# Patient Record
Sex: Male | Born: 1945 | Race: White | Hispanic: No | Marital: Married | Smoking: Former smoker
Health system: Southern US, Community
[De-identification: ages and names within clinical notes are randomized; demographics above are authoritative.]

## PROBLEM LIST (undated history)

## (undated) DIAGNOSIS — N189 Chronic kidney disease, unspecified: Secondary | ICD-10-CM

## (undated) DIAGNOSIS — E785 Hyperlipidemia, unspecified: Secondary | ICD-10-CM

## (undated) DIAGNOSIS — I10 Essential (primary) hypertension: Secondary | ICD-10-CM

## (undated) DIAGNOSIS — K219 Gastro-esophageal reflux disease without esophagitis: Secondary | ICD-10-CM

## (undated) DIAGNOSIS — E039 Hypothyroidism, unspecified: Secondary | ICD-10-CM

## (undated) HISTORY — DX: Hypothyroidism, unspecified: E03.9

## (undated) HISTORY — DX: Gastro-esophageal reflux disease without esophagitis: K21.9

## (undated) HISTORY — DX: Chronic kidney disease, unspecified: N18.9

## (undated) HISTORY — DX: Hyperlipidemia, unspecified: E78.5

## (undated) HISTORY — DX: Essential (primary) hypertension: I10

---
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
## 1993-05-31 HISTORY — PX: FOOT FUSION: SHX956

## 2003-06-01 HISTORY — PX: HERNIA REPAIR: SHX51

## 2009-07-11 ENCOUNTER — Encounter: Admission: RE | Admit: 2009-07-11 | Discharge: 2009-07-11 | Payer: Self-pay | Admitting: Nephrology

## 2011-08-02 ENCOUNTER — Encounter: Payer: Self-pay | Admitting: Internal Medicine

## 2011-09-07 ENCOUNTER — Encounter: Payer: Self-pay | Admitting: Internal Medicine

## 2011-09-07 ENCOUNTER — Ambulatory Visit (INDEPENDENT_AMBULATORY_CARE_PROVIDER_SITE_OTHER): Payer: BC Managed Care – PPO | Admitting: Internal Medicine

## 2011-09-07 VITALS — BP 108/74 | HR 64 | Ht 70.0 in | Wt 191.0 lb

## 2011-09-07 DIAGNOSIS — K219 Gastro-esophageal reflux disease without esophagitis: Secondary | ICD-10-CM

## 2011-09-07 MED ORDER — RANITIDINE HCL 150 MG PO TABS: 150.0000 mg | ORAL_TABLET | Freq: Two times a day (BID) | ORAL | Status: AC

## 2011-09-07 NOTE — Patient Instructions (Signed)
You have been scheduled for an endoscopy with propofol. Please follow written instructions given to you at your visit today.  We have sent the following medications to your pharmacy for you to pick up at your convenience:  Ranitidine (Zantac)  You have been given some information on reflux to read over

## 2011-09-07 NOTE — Progress Notes (Signed)
HISTORY OF PRESENT ILLNESS:  Phillip Alvarado is a 66 y.o. male with hypertension, hypothyroidism, chronic kidney disease, and chronic GERD. The patient is new to this practice. He has undergone prior colonoscopy in [place] in 2005 and in [place], and 2010. Results unknown. He presents today regarding chronic GERD and recommendations for medical therapy. Patient has a history of GERD manifested by pyrosis dating back to 2006. He has self medicated with H2 receptor antagonists and OTC PPIs. These have worked nicely. In 2010 he was diagnosed with kidney disease. I have reviewed a note from his nephrologist. This felt to be on the basis of hypertension. Patient was concerned that this may be related to PPI therapy, having read or side effects interstitial nephritis related to the medication. As such, he has been off PPI therapy for several months. He takes antacids for reflux. He has significant reflux symptoms that effect his quality of life. No dysphagia, though globus type sensation. Review of outside laboratories from last month reveal serum creatinine of 1.92. His GI review of systems is otherwise entirely negative.Marland Kitchen  REVIEW OF SYSTEMS:  All non-GI ROS negative except for sore throat  Past Medical History  Diagnosis Date  . Hypothyroidism   . Chronic kidney disease   . Hypertension     Past Surgical History  Procedure Date  . Appendectomy   . Hernia repair   . Foot fusion 1995    Social History Phillip Alvarado  reports that he has quit smoking. He has never used smokeless tobacco. He reports that he drinks alcohol. He reports that he does not use illicit drugs.  family history includes Diabetes in his brother.  Allergies  Allergen Reactions  . Statins     Muscle aches       PHYSICAL EXAMINATION: Vital signs: BP 108/74  Pulse 64  Ht 5\' 10"  (1.778 m)  Wt 191 lb (86.637 kg)  BMI 27.41 kg/m2  Constitutional: generally well-appearing, no acute distress Psychiatric: alert and  oriented x3, cooperative Eyes: extraocular movements intact, anicteric, conjunctiva pink Mouth: oral pharynx moist, no lesions Neck: supple no lymphadenopathy Cardiovascular: heart regular rate and rhythm, no murmur Lungs: clear to auscultation bilaterally Abdomen: soft, nontender, nondistended, no obvious ascites, no peritoneal signs, normal bowel sounds, no organomegaly Extremities: no lower extremity edema bilaterally Skin: no lesions on visible extremities Neuro: No focal deficits.   ASSESSMENT:  #1. Chronic GERD without alarm features #2. Chronic kidney disease #3. Prior colonoscopy. Results unknown   PLAN:  #1. Given the patient's significant symptoms and concerns over PPI therapy, prescrib ranitidine 150 mg by mouth twice a day #2. Reflux precautions #3. GERD literature and reflux precautions information sheet provided #4. Schedule upper endoscopy to rule out Barrett's esophagus or other complications of GERD. This may affect long-term therapy.The nature of the procedure, as well as the risks, benefits, and alternatives were carefully and thoroughly reviewed with the patient. Ample time for discussion and questions allowed. The patient understood, was satisfied, and agreed to proceed.  #5. Obtain a release form for outside colonoscopy for review and incorporation into his record. He is agreeable

## 2011-09-21 ENCOUNTER — Encounter: Payer: Self-pay | Admitting: Internal Medicine

## 2011-09-21 ENCOUNTER — Ambulatory Visit (AMBULATORY_SURGERY_CENTER): Payer: BC Managed Care – PPO | Admitting: Internal Medicine

## 2011-09-21 VITALS — BP 132/41 | HR 52 | Temp 98.1°F | Resp 14 | Ht 70.0 in | Wt 191.0 lb

## 2011-09-21 DIAGNOSIS — K219 Gastro-esophageal reflux disease without esophagitis: Secondary | ICD-10-CM

## 2011-09-21 DIAGNOSIS — K222 Esophageal obstruction: Secondary | ICD-10-CM

## 2011-09-21 MED ORDER — SODIUM CHLORIDE 0.9 % IV SOLN: 500.0000 mL | INTRAVENOUS | Status: DC

## 2011-09-21 NOTE — Progress Notes (Signed)
Patient did not have preoperative order for IV antibiotic SSI prophylaxis. (G8918)  Patient did not experience any of the following events: a burn prior to discharge; a fall within the facility; wrong site/side/patient/procedure/implant event; or a hospital transfer or hospital admission upon discharge from the facility. (G8907)  

## 2011-09-21 NOTE — Patient Instructions (Signed)
YOU HAD AN ENDOSCOPIC PROCEDURE TODAY AT THE [facility]: Refer to the procedure report that was given to you for any specific questions about what was found during the examination.  If the procedure report does not answer your questions, please call your gastroenterologist to clarify.  If you requested that your care partner not be given the details of your procedure findings, then the procedure report has been included in a sealed envelope for you to review at your convenience later.  YOU SHOULD EXPECT: Some feelings of bloating in the abdomen. Passage of more gas than usual.  Walking can help get rid of the air that was put into your GI tract during the procedure and reduce the bloating. If you had a lower endoscopy (such as a colonoscopy or flexible sigmoidoscopy) you may notice spotting of blood in your stool or on the toilet paper. If you underwent a bowel prep for your procedure, then you may not have a normal bowel movement for a few days.  DIET: Your first meal following the procedure should be a light meal and then it is ok to progress to your normal diet.  A half-sandwich or bowl of soup is an example of a good first meal.  Heavy or fried foods are harder to digest and may make you feel nauseous or bloated.  Likewise meals heavy in dairy and vegetables can cause extra gas to form and this can also increase the bloating.  Drink plenty of fluids but you should avoid alcoholic beverages for 24 hours.  ACTIVITY: Your care partner should take you home directly after the procedure.  You should plan to take it easy, moving slowly for the rest of the day.  You can resume normal activity the day after the procedure however you should NOT DRIVE or use heavy machinery for 24 hours (because of the sedation medicines used during the test).    SYMPTOMS TO REPORT IMMEDIATELY: A gastroenterologist can be reached at any hour.  During normal business hours, 8:30 AM to 5:00 PM Monday through Friday,  call [phone].  After hours and on weekends, please call the GI answering service at [phone] who will take a message and have the physician on call contact you.   Following lower endoscopy (colonoscopy or flexible sigmoidoscopy):  Excessive amounts of blood in the stool  Significant tenderness or worsening of abdominal pains  Swelling of the abdomen that is new, acute  Fever of 100F or higher  Following upper endoscopy (EGD)  Vomiting of blood or coffee ground material  New chest pain or pain under the shoulder blades  Painful or persistently difficult swallowing  New shortness of breath  Fever of 100F or higher  Black, tarry-looking stools  FOLLOW UP: If any biopsies were taken you will be contacted by phone or by letter within the next 1-3 weeks.  Call your gastroenterologist if you have not heard about the biopsies in 3 weeks.  Our staff will call the home number listed on your records the next business day following your procedure to check on you and address any questions or concerns that you may have at that time regarding the information given to you following your procedure. This is a courtesy call and so if there is no answer at the home number and we have not heard from you through the emergency physician on call, we will assume that you have returned to your regular daily activities without incident.  SIGNATURES/CONFIDENTIALITY: You and/or your care   partner have signed paperwork which will be entered into your electronic medical record.  These signatures attest to the fact that that the information above on your After Visit Summary has been reviewed and is understood.  Full responsibility of the confidentiality of this discharge information lies with you and/or your care-partner.  

## 2011-09-21 NOTE — Op Note (Signed)
[facility] [address] [place], [place]  [address]  ENDOSCOPY PROCEDURE REPORT  PATIENT:  Phillip Alvarado, Phillip Alvarado  MR#:  [id number] BIRTHDATE:  [birth year], 66 yrs. old  GENDER:  male  ENDOSCOPIST:  Wilhemina Bonito. Eda Keys, MD Referred by:  Irena Cords, M.D.  PROCEDURE DATE:  09/21/2011 PROCEDURE:  EGD, diagnostic 43235 ASA CLASS:  Class II INDICATIONS:  GERD - longstanding  MEDICATIONS:   MAC sedation, administered by CRNA, propofol (Diprivan) 100 mg IV TOPICAL ANESTHETIC:  none  DESCRIPTION OF PROCEDURE:   After the risks benefits and alternatives of the procedure were thoroughly explained, informed consent was obtained.  The LB GIF-H180 [id number] endoscope was introduced through the mouth and advanced to the second portion of the duodenum, without limitations.  The instrument was slowly withdrawn as the mucosa was fully examined. <<PROCEDUREIMAGES>>  Erosive Esophagitis was found in the distal esophagus as well as a stricture  (Grade IV)..  The stomach was entered and closely examined. The antrum, angularis, and lesser curvature were well visualized, including a retroflexed view of the cardia and fundus. The stomach wall was normally distensable. The scope passed easily through the pylorus into the duodenum.  The duodenal bulb was normal in appearance, as was the postbulbar duodenum. Retroflexed views revealed no abnormalities.    The scope was then withdrawn from the patient and the procedure completed.  COMPLICATIONS:  None  ENDOSCOPIC IMPRESSION: 1) Severe Esophagitis in the distal esophagus 2) Stricture in the distal esophagus 3) Normal stomach 4) Normal duodenum  RECOMMENDATIONS: 1) Anti-reflux regimen to be followed 2) Will likely need PPI daily (he wants to discuss with his nephrologist0  ______________________________ Wilhemina Bonito. Eda Keys, MD  CC:  Terrial Rhodes, MD; Carlyon Shadow MD; The Patient  n. eSIGNED:   Wilhemina Bonito. Eda Keys at 09/21/2011 04:00  PM  Dewayne Shorter, [phone]

## 2011-09-22 ENCOUNTER — Telehealth: Payer: Self-pay | Admitting: *Deleted

## 2011-09-22 NOTE — Telephone Encounter (Signed)
  Follow up Call-  Call back number 09/21/2011  Post procedure Call Back phone  # [phone]  Permission to leave phone message Yes     Patient questions:  Do you have a fever, pain , or abdominal swelling? no Pain Score  0 *  Have you tolerated food without any problems? yes  Have you been able to return to your normal activities? yes  Do you have any questions about your discharge instructions: Diet   no Medications  no Follow up visit  no  Do you have questions or concerns about your Care? no  Actions: * If pain score is 4 or above: No action needed, pain <4.

## 2012-10-30 DIAGNOSIS — D509 Iron deficiency anemia, unspecified: Secondary | ICD-10-CM | POA: Diagnosis not present

## 2012-10-30 DIAGNOSIS — I10 Essential (primary) hypertension: Secondary | ICD-10-CM | POA: Diagnosis not present

## 2012-10-30 DIAGNOSIS — N2581 Secondary hyperparathyroidism of renal origin: Secondary | ICD-10-CM | POA: Diagnosis not present

## 2012-12-08 DIAGNOSIS — E039 Hypothyroidism, unspecified: Secondary | ICD-10-CM | POA: Diagnosis not present

## 2012-12-08 DIAGNOSIS — I10 Essential (primary) hypertension: Secondary | ICD-10-CM | POA: Diagnosis not present

## 2012-12-08 DIAGNOSIS — E782 Mixed hyperlipidemia: Secondary | ICD-10-CM | POA: Diagnosis not present

## 2012-12-08 DIAGNOSIS — K219 Gastro-esophageal reflux disease without esophagitis: Secondary | ICD-10-CM | POA: Diagnosis not present

## 2013-02-07 DIAGNOSIS — D509 Iron deficiency anemia, unspecified: Secondary | ICD-10-CM | POA: Diagnosis not present

## 2013-02-07 DIAGNOSIS — N2581 Secondary hyperparathyroidism of renal origin: Secondary | ICD-10-CM | POA: Diagnosis not present

## 2013-02-07 DIAGNOSIS — I10 Essential (primary) hypertension: Secondary | ICD-10-CM | POA: Diagnosis not present

## 2013-05-04 DIAGNOSIS — E782 Mixed hyperlipidemia: Secondary | ICD-10-CM | POA: Diagnosis not present

## 2013-05-04 DIAGNOSIS — E039 Hypothyroidism, unspecified: Secondary | ICD-10-CM | POA: Diagnosis not present

## 2013-08-12 ENCOUNTER — Emergency Department (HOSPITAL_COMMUNITY): Payer: Medicare Other

## 2013-08-12 ENCOUNTER — Encounter (HOSPITAL_COMMUNITY): Payer: Self-pay | Admitting: Emergency Medicine

## 2013-08-12 ENCOUNTER — Emergency Department (HOSPITAL_COMMUNITY)
Admission: EM | Admit: 2013-08-12 | Discharge: 2013-08-12 | Disposition: A | Discharge status: Home / Self Care | Payer: Medicare Other | Attending: Emergency Medicine | Admitting: Emergency Medicine

## 2013-08-12 DIAGNOSIS — R404 Transient alteration of awareness: Secondary | ICD-10-CM | POA: Diagnosis not present

## 2013-08-12 DIAGNOSIS — N189 Chronic kidney disease, unspecified: Secondary | ICD-10-CM | POA: Diagnosis not present

## 2013-08-12 DIAGNOSIS — R42 Dizziness and giddiness: Secondary | ICD-10-CM | POA: Diagnosis not present

## 2013-08-12 DIAGNOSIS — Z79899 Other long term (current) drug therapy: Secondary | ICD-10-CM | POA: Diagnosis not present

## 2013-08-12 DIAGNOSIS — I129 Hypertensive chronic kidney disease with stage 1 through stage 4 chronic kidney disease, or unspecified chronic kidney disease: Secondary | ICD-10-CM | POA: Insufficient documentation

## 2013-08-12 DIAGNOSIS — Y9301 Activity, walking, marching and hiking: Secondary | ICD-10-CM | POA: Insufficient documentation

## 2013-08-12 DIAGNOSIS — E039 Hypothyroidism, unspecified: Secondary | ICD-10-CM | POA: Insufficient documentation

## 2013-08-12 DIAGNOSIS — Z87891 Personal history of nicotine dependence: Secondary | ICD-10-CM | POA: Insufficient documentation

## 2013-08-12 DIAGNOSIS — S6990XA Unspecified injury of unspecified wrist, hand and finger(s), initial encounter: Secondary | ICD-10-CM | POA: Insufficient documentation

## 2013-08-12 DIAGNOSIS — R55 Syncope and collapse: Secondary | ICD-10-CM | POA: Insufficient documentation

## 2013-08-12 DIAGNOSIS — R296 Repeated falls: Secondary | ICD-10-CM | POA: Insufficient documentation

## 2013-08-12 DIAGNOSIS — Y9229 Other specified public building as the place of occurrence of the external cause: Secondary | ICD-10-CM | POA: Insufficient documentation

## 2013-08-12 DIAGNOSIS — I10 Essential (primary) hypertension: Secondary | ICD-10-CM | POA: Diagnosis not present

## 2013-08-12 LAB — COMPREHENSIVE METABOLIC PANEL
ALT: 29 U/L (ref 0–53)
AST: 34 U/L (ref 0–37)
Albumin: 4.1 g/dL (ref 3.5–5.2)
Alkaline Phosphatase: 51 U/L (ref 39–117)
BILIRUBIN TOTAL: 0.3 mg/dL (ref 0.3–1.2)
BUN: 22 mg/dL (ref 6–23)
CO2: 23 mEq/L (ref 19–32)
Calcium: 9.5 mg/dL (ref 8.4–10.5)
Chloride: 104 mEq/L (ref 96–112)
Creatinine, Ser: 1.76 mg/dL — ABNORMAL HIGH (ref 0.50–1.35)
GFR, EST AFRICAN AMERICAN: 44 mL/min — AB (ref >90)
GFR, EST NON AFRICAN AMERICAN: 38 mL/min — AB (ref >90)
Glucose, Bld: 105 mg/dL — ABNORMAL HIGH (ref 70–99)
POTASSIUM: 3.9 meq/L (ref 3.7–5.3)
SODIUM: 140 meq/L (ref 137–147)
Total Protein: 7 g/dL (ref 6.0–8.3)

## 2013-08-12 LAB — CBC WITH DIFFERENTIAL/PLATELET
BASOS ABS: 0 10*3/uL (ref 0.0–0.1)
Basophils Relative: 0 % (ref 0–1)
EOS ABS: 0.1 10*3/uL (ref 0.0–0.7)
EOS PCT: 1 % (ref 0–5)
HEMATOCRIT: 36.1 % — AB (ref 39.0–52.0)
HEMOGLOBIN: 12.4 g/dL — AB (ref 13.0–17.0)
Lymphocytes Relative: 33 % (ref 12–46)
Lymphs Abs: 1.9 10*3/uL (ref 0.7–4.0)
MCH: 30.6 pg (ref 26.0–34.0)
MCHC: 34.3 g/dL (ref 30.0–36.0)
MCV: 89.1 fL (ref 78.0–100.0)
MONOS PCT: 6 % (ref 3–12)
Monocytes Absolute: 0.3 10*3/uL (ref 0.1–1.0)
Neutro Abs: 3.4 10*3/uL (ref 1.7–7.7)
Neutrophils Relative %: 59 % (ref 43–77)
PLATELETS: 246 10*3/uL (ref 150–400)
RBC: 4.05 MIL/uL — AB (ref 4.22–5.81)
RDW: 13 % (ref 11.5–15.5)
WBC: 5.8 10*3/uL (ref 4.0–10.5)

## 2013-08-12 LAB — I-STAT TROPONIN, ED: Troponin i, poc: 0 ng/mL (ref 0.00–0.08)

## 2013-08-12 LAB — CBG MONITORING, ED: GLUCOSE-CAPILLARY: 145 mg/dL — AB (ref 70–99)

## 2013-08-12 NOTE — ED Provider Notes (Signed)
CSN: [id number]     Arrival date & time 08/12/13  0175 History   First MD Initiated Contact with Patient 08/12/13 1027     Chief Complaint  Patient presents with  . Near Syncope     (Consider location/radiation/quality/duration/timing/severity/associated sxs/prior Treatment) The history is provided by the patient and medical records.  This is a 68 year old male with past medical history significant for hypertension, chronic kidney disease, hyperthyroidism, presenting to the ED via EMS for syncopal episode.  Patient states he was standing in the back of the church for approximately 30 minutes, when he got a flushed sensation over his body. States he was concerned his blood pressure may have dropped too low from standing still, so he attempted to walk around and get some fresh air when he collapsed. Family friend who witnessed incident is present at bedside and denies head trauma.  Witness states patient quickly regained consciousness, attempted to stand and passed out again. EMS was called. Witness states that EMS appeared to be having difficulty finding his pulse, so they went to get the defibrillator but by the time witness returned pt had again regained consciousness.  Patient states he fell somewhat groggy when he came to, but was not disoriented.  He denies prior history of syncopal episodes or seizure disorder. No prior cardiac hx.  Currently he denies any chest pain, shortness of breath, palpitations, dizziness, lightheadedness, or headache.  He does complain of some right hand pain where he thinks he fell onto his hand. He denies any numbness or paresthesias of his extremities.  VS stable on arrival.  Past Medical History  Diagnosis Date  . Hypothyroidism   . Chronic kidney disease   . Hypertension    Past Surgical History  Procedure Laterality Date  . Appendectomy    . Hernia repair    . Foot fusion  1995   Family History  Problem Relation Age of Onset  . Diabetes Brother     History  Substance Use Topics  . Smoking status: Former Research scientist (life sciences)  . Smokeless tobacco: Never Used  . Alcohol Use: Yes    Review of Systems  Neurological: Positive for syncope.  All other systems reviewed and are negative.   Allergies  Statins  Home Medications   Current Outpatient Rx  Name  Route  Sig  Dispense  Refill  . atenolol (TENORMIN) 25 MG tablet   Oral   Take 25 mg by mouth daily.         . Calcium Carbonate Antacid (TUMS CALCIUM FOR LIFE BONE PO)   Oral   Take 1 tablet by mouth daily.         Marland Kitchen doxazosin (CARDURA) 2 MG tablet   Oral   Take 2 mg by mouth at bedtime.         . fenofibrate (TRICOR) 145 MG tablet   Oral   Take 145 mg by mouth daily.         Marland Kitchen levothyroxine (SYNTHROID, LEVOTHROID) 50 MCG tablet   Oral   Take 50 mcg by mouth daily.         . polyethylene glycol (MIRALAX / GLYCOLAX) packet   Oral   Take 17 g by mouth daily.          BP 109/65  Pulse 56  Temp(Src) 97.9 F (36.6 C) (Oral)  Resp 13  SpO2 94%  Physical Exam  Nursing note and vitals reviewed. Constitutional: He is oriented to person, place, and time. He appears well-developed and  well-nourished.  HENT:  Head: Normocephalic and atraumatic.  Mouth/Throat: Oropharynx is clear and moist.  Eyes: Conjunctivae and EOM are normal. Pupils are equal, round, and reactive to light.  EOM intact; eyes will cross midline  Neck: Normal range of motion.  Cardiovascular: Normal rate, regular rhythm and normal heart sounds.   Pulmonary/Chest: Effort normal and breath sounds normal. No respiratory distress. He has no wheezes.  Abdominal: Soft. Bowel sounds are normal. There is no tenderness. There is no guarding.  Musculoskeletal: Normal range of motion.  Neurological: He is alert and oriented to person, place, and time. He has normal strength. He displays no tremor. No cranial nerve deficit or sensory deficit. He displays no seizure activity.  AAOx3, answering questions and  following commands appropriately; equal strength UE and LE bilaterally; CN grossly intact; moves all extremities appropriately without ataxia; finger to nose WNL bilaterally; no focal neuro deficits or facial asymmetry appreciated  Skin: Skin is warm and dry.  Psychiatric: He has a normal mood and affect.    ED Course  Procedures (including critical care time) Labs Review Labs Reviewed  CBC WITH DIFFERENTIAL - Abnormal; Notable for the following:    RBC 4.05 (*)    Hemoglobin 12.4 (*)    HCT 36.1 (*)    All other components within normal limits  COMPREHENSIVE METABOLIC PANEL - Abnormal; Notable for the following:    Glucose, Bld 105 (*)    Creatinine, Ser 1.76 (*)    GFR calc non Af Amer 38 (*)    GFR calc Af Amer 44 (*)    All other components within normal limits  CBG MONITORING, ED - Abnormal; Notable for the following:    Glucose-Capillary 145 (*)    All other components within normal limits  I-STAT TROPOININ, ED   Imaging Review Ct Head Wo Contrast  08/12/2013   CLINICAL DATA:  Syncope  EXAM: CT HEAD WITHOUT CONTRAST  TECHNIQUE: Contiguous axial images were obtained from the base of the skull through the vertex without intravenous contrast.  COMPARISON:  None.  FINDINGS: Negative for acute intracranial hemorrhage, acute infarction, mass, mass effect, hydrocephalus or midline shift. Gray-white differentiation is preserved throughout. Mild to moderate cerebral and cerebellar volume loss slightly advanced for age. Focal well defined my hypoattenuation within the left putaminal consistent with a remote lacunar infarct. No acute soft tissue or calvarial abnormality. The globes and orbits are symmetric and unremarkable. Normal aeration of the mastoid air cells and visualized paranasal sinuses.  IMPRESSION: 1. No acute intracranial abnormality. 2. Mild cerebral and cerebellar volume loss. 3. Remote left basal ganglia lacunar infarct.   Electronically Signed   By: Jacqulynn Cadet M.D.   On:  08/12/2013 11:49   Dg Hand Complete Right  08/12/2013   CLINICAL DATA:  Fall.  Pain throughout the first metacarpal.  EXAM: RIGHT HAND - COMPLETE 3+ VIEW  COMPARISON:  None.  FINDINGS: Degenerative changes are present at the first [facility] joint as well as the first MCP joint. 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 The joints are located. No acute bone or soft tissue abnormality is present.  IMPRESSION: 1. Moderate degenerative changes throughout the hand including the first [facility] and first an the CP joints. 2. No acute osseous abnormality.   Electronically Signed   By: Lawrence Santiago M.D.   On: 08/12/2013 11:50     EKG Interpretation   Date/Time:  Sunday August 12 2013 10:00:41 EDT Ventricular Rate:  54 PR Interval:  264 QRS  Duration: 87 QT Interval:  432 QTC Calculation: 409 R Axis:   19 Text Interpretation:  Sinus rhythm with first degree av block Prolonged PR  interval Low voltage, precordial leads Confirmed by DELOS  MD, DOUGLAS  ([id number]) on 08/12/2013 10:45:56 AM      MDM   Final diagnoses:  Syncope   EKG NSR, 1st degree AV block-- no old for comparison.  Labs as above, serum creatinine 1.76-- patient has a history of CKD.  CT head negative for acute findings.  Orthostatic vital signs within normal limits.  Patient has remained asymptomatic while in the emergency department and his neuro exam is without concerning focal neuro deficits. Recollection of events from today sound vasovagal in nature-- pt has no prior cardiac conditions or hx of seizure disorder.    Pt will FU with his PCP this week for re-check. Signs/sx that would warrant ED return including chest pain, SOB, recurrent syncope, dizziness, weakness, numbness of extremities, etc. Were discussed-- pt and family acknowledged understanding and agreed with plan of care.  Case discussed with Dr. Stark Jock who personally evaluated pt and agrees with assessment and plan of care.  Larene Pickett, PA-C 08/12/13  1404

## 2013-08-12 NOTE — ED Provider Notes (Signed)
Medical screening examination/treatment/procedure(s) were conducted as a shared visit with non-physician practitioner(s) and myself.  I personally evaluated the patient during the encounter. Patient is a 68 year old male presents for evaluation of syncope. He was feeling well this morning and then went to church. While standing in the congregation, he began to feel lightheaded and fainted. He woke up approximately 1 minute later with other church members helping him. He denies any chest pain or any shortness of breath. He now feels fine and is without complaint.  On exam, vitals are stable and the patient is afebrile. Head is atraumatic normocephalic. Neck is supple. Heart is regular rate and rhythm without murmurs. Lungs are clear and equal bilaterally. Neurologic exam is nonfocal.  Workup reveals a normal head CT, unremarkable laboratory studies, and EKG which reveals a normal sinus rhythm. He was observed for several hours and had no recurrence and remained symptom-free. He had no ectopy on telemetry. He has no prior cardiac history and his symptoms sound vasovagal in nature. I do not feel as though admission is indicated and he is appropriate for discharge with followup with his primary physician later this week. He understands to return if his symptoms return or he develops other new and concerning symptoms.   EKG Interpretation   Date/Time:  Sunday August 12 2013 10:00:41 EDT Ventricular Rate:  54 PR Interval:  264 QRS Duration: 87 QT Interval:  432 QTC Calculation: 409 R Axis:   19 Text Interpretation:  Sinus rhythm with first degree av block Prolonged PR  interval Low voltage, precordial leads Confirmed by Beau Fanny  MD, Fusae Florio  ([id number]) on 08/12/2013 10:45:56 AM       Veryl Speak, MD 08/12/13 1535

## 2013-08-12 NOTE — ED Notes (Signed)
Per [facility] pt at church standing in the back of the church when he had a syncopal episode, a Museum/gallery conservator assisted pt to the back pew, pt did fall to ground, pt c/o Right hand discomfort, pt did not hit his head, brief period of LOC, pt pale, clammy, and responding upon EMS arrival, SBP 92, HR 51, RR 16, 500 cc bolus given BP then 107/63. Per spouse they could not palpate a pulse to his wrist or neck on pt during his syncopal episode. 20 G LH SL

## 2013-08-12 NOTE — Discharge Instructions (Signed)
Continue all home meds as directed. Follow up with Dr. Olen Pel this week for re-check. Return to the emergency department if you develop chest pain, shortness of breath, palpitations, dizziness, weakness, numbness of extremities, or for recurrent syncope.

## 2013-08-12 NOTE — ED Notes (Signed)
Pt presents to ED via [facility], pt reports while standing in the back of the church this am he began feeling faint, states they were standing for approx 30 mins, he remembers feeling warm and faint. The next thing he remembers he woke up on a pew with a lot of people standing over him. spouse reports he fell to the ground, did not hit his head. Pt described it as "a very pleasant sleep." remembers waking and briefly feeling confused to location. Pt denies any chest pain, SOB, fever, chills, abd pain, nausea or vomiting today or any days this week. Pt a/o x4, NAD. Pt admits he has HTN and takes Atenolol. EMS 12 lead shows SR with 1st degree A-V block

## 2013-08-13 DIAGNOSIS — R55 Syncope and collapse: Secondary | ICD-10-CM | POA: Diagnosis not present

## 2013-08-13 DIAGNOSIS — I10 Essential (primary) hypertension: Secondary | ICD-10-CM | POA: Diagnosis not present

## 2013-08-13 DIAGNOSIS — N183 Chronic kidney disease, stage 3 unspecified: Secondary | ICD-10-CM | POA: Diagnosis not present

## 2013-08-13 DIAGNOSIS — D649 Anemia, unspecified: Secondary | ICD-10-CM | POA: Diagnosis not present

## 2013-09-07 DIAGNOSIS — Z23 Encounter for immunization: Secondary | ICD-10-CM | POA: Diagnosis not present

## 2013-09-07 DIAGNOSIS — I10 Essential (primary) hypertension: Secondary | ICD-10-CM | POA: Diagnosis not present

## 2014-01-02 DIAGNOSIS — I10 Essential (primary) hypertension: Secondary | ICD-10-CM | POA: Diagnosis not present

## 2014-01-02 DIAGNOSIS — E039 Hypothyroidism, unspecified: Secondary | ICD-10-CM | POA: Diagnosis not present

## 2014-01-02 DIAGNOSIS — N183 Chronic kidney disease, stage 3 unspecified: Secondary | ICD-10-CM | POA: Diagnosis not present

## 2014-01-02 DIAGNOSIS — E7800 Pure hypercholesterolemia, unspecified: Secondary | ICD-10-CM | POA: Diagnosis not present

## 2014-04-29 DIAGNOSIS — Z23 Encounter for immunization: Secondary | ICD-10-CM | POA: Diagnosis not present

## 2014-07-31 DIAGNOSIS — L821 Other seborrheic keratosis: Secondary | ICD-10-CM | POA: Diagnosis not present

## 2014-07-31 DIAGNOSIS — L820 Inflamed seborrheic keratosis: Secondary | ICD-10-CM | POA: Diagnosis not present

## 2014-08-19 DIAGNOSIS — R42 Dizziness and giddiness: Secondary | ICD-10-CM | POA: Diagnosis not present

## 2014-09-11 DIAGNOSIS — N183 Chronic kidney disease, stage 3 (moderate): Secondary | ICD-10-CM | POA: Diagnosis not present

## 2014-10-03 DIAGNOSIS — E780 Pure hypercholesterolemia: Secondary | ICD-10-CM | POA: Diagnosis not present

## 2014-10-03 DIAGNOSIS — E039 Hypothyroidism, unspecified: Secondary | ICD-10-CM | POA: Diagnosis not present

## 2014-10-03 DIAGNOSIS — N183 Chronic kidney disease, stage 3 (moderate): Secondary | ICD-10-CM | POA: Diagnosis not present

## 2014-10-03 DIAGNOSIS — I10 Essential (primary) hypertension: Secondary | ICD-10-CM | POA: Diagnosis not present

## 2014-11-01 ENCOUNTER — Other Ambulatory Visit: Payer: Self-pay

## 2014-11-01 DIAGNOSIS — M79606 Pain in leg, unspecified: Secondary | ICD-10-CM

## 2014-11-25 ENCOUNTER — Other Ambulatory Visit: Payer: Self-pay

## 2014-12-03 ENCOUNTER — Encounter: Payer: Self-pay | Admitting: Vascular Surgery

## 2014-12-06 ENCOUNTER — Encounter: Payer: Self-pay | Admitting: Vascular Surgery

## 2014-12-06 ENCOUNTER — Ambulatory Visit (INDEPENDENT_AMBULATORY_CARE_PROVIDER_SITE_OTHER): Payer: Medicare Other | Admitting: Vascular Surgery

## 2014-12-06 ENCOUNTER — Ambulatory Visit (HOSPITAL_COMMUNITY)
Admission: RE | Admit: 2014-12-06 | Discharge: 2014-12-06 | Disposition: A | Discharge status: Home / Self Care | Payer: Medicare Other | Source: Ambulatory Visit | Attending: Vascular Surgery | Admitting: Vascular Surgery

## 2014-12-06 VITALS — BP 126/80 | HR 63 | Resp 16 | Ht 70.0 in | Wt 200.0 lb

## 2014-12-06 DIAGNOSIS — I779 Disorder of arteries and arterioles, unspecified: Secondary | ICD-10-CM | POA: Insufficient documentation

## 2014-12-06 DIAGNOSIS — M79606 Pain in leg, unspecified: Secondary | ICD-10-CM | POA: Diagnosis not present

## 2014-12-06 DIAGNOSIS — M25552 Pain in left hip: Secondary | ICD-10-CM | POA: Insufficient documentation

## 2014-12-06 NOTE — Progress Notes (Signed)
Referred by:  Orpah Melter, MD [address] [place], [place] [address]  Reason for referral: L leg pain  History of Present Illness  Phillip Alvarado is a 69 y.o. (1945-09-14) male who presents with chief complaint: resolving L leg pain.  This patient notes onset of radiating pain from L hip down posterior L leg since ~2-3 months ago.  This pain is position and improves with movement.  He has no rest pain or intermittent claudication.  He notes the pain improved once he started sleeping on his back.  The patient denies any residual neuropathic sx.   Past Medical History  Diagnosis Date  . Hypothyroidism   . Chronic kidney disease   . Hypertension     Past Surgical History  Procedure Laterality Date  . Appendectomy    . Hernia repair    . Foot fusion  1995    History   Social History  . Marital Status: Single    Spouse Name: N/A  . Number of Children: 2  . Years of Education: N/A   Occupational History  . Manager    Social History Main Topics  . Smoking status: Former Smoker    Quit date: 12/06/1978  . Smokeless tobacco: Never Used  . Alcohol Use: Yes  . Drug Use: No  . Sexual Activity: Not on file   Other Topics Concern  . Not on file   Social History Narrative    Family History  Problem Relation Age of Onset  . Diabetes Brother   . Heart disease Brother     Before age 34    Current Outpatient Prescriptions  Medication Sig Dispense Refill  . atenolol (TENORMIN) 25 MG tablet Take 25 mg by mouth daily.    Marland Kitchen doxazosin (CARDURA) 2 MG tablet Take 2 mg by mouth at bedtime.    . fenofibrate (TRICOR) 145 MG tablet Take 145 mg by mouth daily.    . lansoprazole (PREVACID) 15 MG capsule Take 15 mg by mouth daily at 12 noon.    Marland Kitchen levothyroxine (SYNTHROID, LEVOTHROID) 100 MCG tablet     . levothyroxine (SYNTHROID, LEVOTHROID) 50 MCG tablet Take 50 mcg by mouth daily.    . polyethylene glycol (MIRALAX / GLYCOLAX) packet Take 17 g by mouth daily.     No  current facility-administered medications for this visit.     Allergies  Allergen Reactions  . Statins Other (See Comments)    Muscle aches    REVIEW OF SYSTEMS:  (Positives checked otherwise negative)  CARDIOVASCULAR:   [ ]  chest pain,  [ ]  chest pressure,  [ ]  palpitations,  [ ]  shortness of breath when laying flat,  [ ]  shortness of breath with exertion,   [x]  pain in feet when walking,  [ ]  pain in feet when laying flat, [ ]  history of blood clot in veins (DVT),  [ ]  history of phlebitis,  [ ]  swelling in legs,  [ ]  varicose veins  PULMONARY:   [ ]  productive cough,  [ ]  asthma,  [ ]  wheezing  NEUROLOGIC:   [ ]  weakness in arms or legs,  [ ]  numbness in arms or legs,  [ ]  difficulty speaking or slurred speech,  [ ]  temporary loss of vision in one eye,  [ ]  dizziness  HEMATOLOGIC:   [ ]  bleeding problems,  [ ]  problems with blood clotting too easily  MUSCULOSKEL:   [ ]  joint pain, [ ]  joint swelling  GASTROINTEST:   [ ]  vomiting blood,  [ ]  blood in stool     GENITOURINARY:   [ ]  burning with urination,  [ ]  blood in urine  PSYCHIATRIC:   [ ]  history of major depression  INTEGUMENTARY:   [ ]  rashes,  [ ]  ulcers  CONSTITUTIONAL:   [ ]  fever,  [ ]  chills   For VQI Use Only  PRE-ADM LIVING: Home  AMB STATUS: Ambulatory  CAD Sx: None  PRIOR CHF: None  STRESS TEST: [x]  No, [ ]  Normal, [ ]  + ischemia, [ ]  + MI, [ ]  Both   Physical Examination  Filed Vitals:   12/06/14 0911  BP: 126/80  Pulse: 63  Resp: 16  Height: 5\' 10"  (1.778 m)  Weight: 200 lb (90.719 kg)  SpO2: 96%    Body mass index is 28.7 kg/(m^2).  General: A&O x 3, WDWN  Head: Benjamin/AT  Ear/Nose/Throat: Hearing grossly intact, nares w/o erythema or drainage, oropharynx w/o Erythema/Exudate, Mallampati score: 3  Eyes: PERRLA, EOMI  Neck: Supple, no nuchal rigidity, no palpable LAD  Pulmonary: Sym exp, good air movt, CTAB, no rales, rhonchi, & wheezing  Cardiac:  RRR, Nl S1, S2, no Murmurs, rubs or gallops  Vascular: Vessel Right Left  Radial Palpable Palpable  Brachial Palpable Palpable  Carotid Palpable, without bruit Palpable, without bruit  Aorta Not palpable N/A  Femoral Palpable Palpable  Popliteal Not palpable Not palpable  PT Palpable Palpable  DP Palpable Palpable   Gastrointestinal: soft, NTND, no G/R, no HSM, no masses, no CVAT B  Musculoskeletal: M/S 5/5 throughout , Extremities without ischemic changes   Neurologic: CN 2-12 intact , Pain and light touch intact in extremities , Motor exam as listed above  Psychiatric: Judgment intact, Mood & affect appropriate for pt's clinical situation  Dermatologic: See M/S exam for extremity exam, no rashes otherwise noted  Lymph : No Cervical, Axillary, or Inguinal lymphadenopathy    Non-Invasive Vascular Imaging  ABI (Date: 12/06/2014)  RLE: 1.34, PT: tri, DP: bi, TBI: 1.13  LLE: 1.30, PT: tri, DP: tri, TBI: 1.07   Outside Studies/Documentation 4 pages of outside documents were reviewed including: outpatient nephrology chart.   Medical Decision Making  Phillip Alvarado is a 69 y.o. male who presents with: chronic kidney disease stage III, mild PAD.   ABI demonstrate bilateral medial calcification.  The R DP signal is greatly attenuated suggesting some degree of stenosis or occlusion  I discussed in depth with the patient the nature of atherosclerosis, and emphasized the importance of maximal medical management including strict control of blood pressure, blood glucose, and lipid levels, antiplatelet agents, obtaining regular exercise, and cessation of smoking.    The patient is aware that without maximal medical management the underlying atherosclerotic disease process will progress, limiting the benefit of any interventions. The patient is currently not on a statin: due to myalgias on statins. The patient is currently not on an anti-platele.  The patient will be started on ASA  81 mg PO daily. Pt can follow up with [place] as needed per patient's wishes.  Thank you for allowing [place] to participate in this patient's care.   Adele Barthel, MD [facility] Office: [phone] Pager: [phone]  12/06/2014, 12:57 PM

## 2015-01-15 DIAGNOSIS — N183 Chronic kidney disease, stage 3 (moderate): Secondary | ICD-10-CM | POA: Diagnosis not present

## 2015-05-02 DIAGNOSIS — L820 Inflamed seborrheic keratosis: Secondary | ICD-10-CM | POA: Diagnosis not present

## 2015-05-08 DIAGNOSIS — Z23 Encounter for immunization: Secondary | ICD-10-CM | POA: Diagnosis not present

## 2015-05-22 DIAGNOSIS — N183 Chronic kidney disease, stage 3 (moderate): Secondary | ICD-10-CM | POA: Diagnosis not present

## 2015-10-20 DIAGNOSIS — N183 Chronic kidney disease, stage 3 (moderate): Secondary | ICD-10-CM | POA: Diagnosis not present

## 2015-10-20 DIAGNOSIS — M79662 Pain in left lower leg: Secondary | ICD-10-CM | POA: Diagnosis not present

## 2015-10-20 DIAGNOSIS — M79661 Pain in right lower leg: Secondary | ICD-10-CM | POA: Diagnosis not present

## 2015-10-20 DIAGNOSIS — I10 Essential (primary) hypertension: Secondary | ICD-10-CM | POA: Diagnosis not present

## 2015-10-20 DIAGNOSIS — N400 Enlarged prostate without lower urinary tract symptoms: Secondary | ICD-10-CM | POA: Diagnosis not present

## 2015-10-20 DIAGNOSIS — E7800 Pure hypercholesterolemia, unspecified: Secondary | ICD-10-CM | POA: Diagnosis not present

## 2015-10-20 DIAGNOSIS — E039 Hypothyroidism, unspecified: Secondary | ICD-10-CM | POA: Diagnosis not present

## 2015-10-22 DIAGNOSIS — Z1211 Encounter for screening for malignant neoplasm of colon: Secondary | ICD-10-CM | POA: Diagnosis not present

## 2015-10-22 DIAGNOSIS — N183 Chronic kidney disease, stage 3 (moderate): Secondary | ICD-10-CM | POA: Diagnosis not present

## 2015-10-22 DIAGNOSIS — Z23 Encounter for immunization: Secondary | ICD-10-CM | POA: Diagnosis not present

## 2015-10-22 DIAGNOSIS — E039 Hypothyroidism, unspecified: Secondary | ICD-10-CM | POA: Diagnosis not present

## 2015-10-22 DIAGNOSIS — N400 Enlarged prostate without lower urinary tract symptoms: Secondary | ICD-10-CM | POA: Diagnosis not present

## 2015-10-22 DIAGNOSIS — E782 Mixed hyperlipidemia: Secondary | ICD-10-CM | POA: Diagnosis not present

## 2015-10-22 DIAGNOSIS — Z125 Encounter for screening for malignant neoplasm of prostate: Secondary | ICD-10-CM | POA: Diagnosis not present

## 2015-10-22 DIAGNOSIS — Z0000 Encounter for general adult medical examination without abnormal findings: Secondary | ICD-10-CM | POA: Diagnosis not present

## 2015-10-22 DIAGNOSIS — I10 Essential (primary) hypertension: Secondary | ICD-10-CM | POA: Diagnosis not present

## 2015-10-22 DIAGNOSIS — R21 Rash and other nonspecific skin eruption: Secondary | ICD-10-CM | POA: Diagnosis not present

## 2015-11-10 DIAGNOSIS — R42 Dizziness and giddiness: Secondary | ICD-10-CM | POA: Diagnosis not present

## 2015-11-11 ENCOUNTER — Ambulatory Visit (HOSPITAL_BASED_OUTPATIENT_CLINIC_OR_DEPARTMENT_OTHER)
Admission: RE | Admit: 2015-11-11 | Discharge: 2015-11-11 | Disposition: A | Discharge status: Home / Self Care | Payer: Medicare Other | Source: Ambulatory Visit | Attending: Family Medicine | Admitting: Family Medicine

## 2015-11-11 ENCOUNTER — Other Ambulatory Visit (HOSPITAL_BASED_OUTPATIENT_CLINIC_OR_DEPARTMENT_OTHER): Payer: Self-pay | Admitting: Family Medicine

## 2015-11-11 DIAGNOSIS — R42 Dizziness and giddiness: Secondary | ICD-10-CM

## 2015-11-11 DIAGNOSIS — H538 Other visual disturbances: Secondary | ICD-10-CM | POA: Diagnosis not present

## 2015-11-24 ENCOUNTER — Encounter: Payer: Self-pay | Admitting: *Deleted

## 2015-11-25 ENCOUNTER — Encounter: Payer: Self-pay | Admitting: Diagnostic Neuroimaging

## 2015-11-25 ENCOUNTER — Ambulatory Visit (INDEPENDENT_AMBULATORY_CARE_PROVIDER_SITE_OTHER): Payer: Medicare Other | Admitting: Diagnostic Neuroimaging

## 2015-11-25 VITALS — BP 106/63 | HR 63 | Ht 69.5 in | Wt 197.2 lb

## 2015-11-25 DIAGNOSIS — Z79899 Other long term (current) drug therapy: Secondary | ICD-10-CM | POA: Diagnosis not present

## 2015-11-25 DIAGNOSIS — R42 Dizziness and giddiness: Secondary | ICD-10-CM

## 2015-11-25 DIAGNOSIS — R6889 Other general symptoms and signs: Secondary | ICD-10-CM | POA: Diagnosis not present

## 2015-11-25 DIAGNOSIS — G459 Transient cerebral ischemic attack, unspecified: Secondary | ICD-10-CM | POA: Diagnosis not present

## 2015-11-25 DIAGNOSIS — R55 Syncope and collapse: Secondary | ICD-10-CM | POA: Diagnosis not present

## 2015-11-25 NOTE — Progress Notes (Signed)
[facility]  PATIENT: Phillip Alvarado DOB: November 19, 1945   REFERRING CLINICIAN: Olen Pel, S HISTORY FROM: patient  REASON FOR VISIT: new consult    HISTORICAL  CHIEF COMPLAINT:  Chief Complaint  Patient presents with  . Dizziness    rm 7, New Pt, "occas dizzy spells x 4, 3 while driving, 1 woke me from sleep; no pattern)    HISTORY OF PRESENT ILLNESS:   70 year old male here for evaluation of dizzy spells. Patient has history of hypertension, hypercholesteremia, kidney disease.  Over past 4 months patient has had 4 events of "head spinning" sensation and difficulty with "vision processing". 3 episodes occurred while driving his car at nighttime. Episodes lasted 1 minute each. Patient was slightly concerned about his driving ability, but was able to continue driving without difficulty. Last event occurred on 11/06/2015. In addition one episode occurred during his sleep" woke him up" and he felt a spinning sensation in his head.  2012 patient had episode of possible benign positional vertigo where he had room spinning sensation, lasting for a few days, possibly triggered by position changes. No nausea vomiting. No vision changes. Patient tried Epley maneuvers at that time and symptoms resolved on their own.  2015 patient was standing at church, felt hot lightheaded and passed out. He stood up after initial passing out and passed out a second time. EMS was called and blood pressure was noted to be 88/58.  Patient also had episode of passing out as a teenager when he stood up too fast and passed out.  No focal numbness, weakness, slurred speech, facial droop, unilateral vision loss. No significant headaches, chest pain, shortness of breath.    REVIEW OF SYSTEMS: Full 14 system review of systems performed and negative with exception of: Dizziness.  ALLERGIES: Allergies  Allergen Reactions  . Statins Other (See Comments)    Muscle aches Lipitor Gemfibrozil     HOME MEDICATIONS: Outpatient Prescriptions Prior to Visit  Medication Sig Dispense Refill  . atenolol (TENORMIN) 25 MG tablet Take 25 mg by mouth daily.    Marland Kitchen doxazosin (CARDURA) 2 MG tablet Take 2 mg by mouth at bedtime.    . fenofibrate (TRICOR) 145 MG tablet Take 145 mg by mouth daily.    . lansoprazole (PREVACID) 15 MG capsule Take 15 mg by mouth daily at 12 noon.    Marland Kitchen levothyroxine (SYNTHROID, LEVOTHROID) 50 MCG tablet Take 50 mcg by mouth daily.    . polyethylene glycol (MIRALAX / GLYCOLAX) packet Take 17 g by mouth daily.    Marland Kitchen levothyroxine (SYNTHROID, LEVOTHROID) 100 MCG tablet      No facility-administered medications prior to visit.    PAST MEDICAL HISTORY: Past Medical History  Diagnosis Date  . Hypothyroidism   . Chronic kidney disease   . Hypertension   . GERD (gastroesophageal reflux disease)   . Hyperlipidemia     PAST SURGICAL HISTORY: Past Surgical History  Procedure Laterality Date  . Appendectomy  1965  . Hernia repair  2005  . Foot fusion  1995    FAMILY HISTORY: Family History  Problem Relation Age of Onset  . Diabetes Brother   . Heart disease Brother     Before age 8    SOCIAL HISTORY:  Social History   Social History  . Marital Status: Married    Spouse Name: Izora Gala   . Number of Children: 2  . Years of Education: 16   Occupational History  . Manager     retired  Social History Main Topics  . Smoking status: Former Smoker    Quit date: 12/06/1978  . Smokeless tobacco: Never Used  . Alcohol Use: 0.0 oz/week    0 Standard drinks or equivalent per week     Comment: 1 per month  . Drug Use: No  . Sexual Activity: Not on file   Other Topics Concern  . Not on file   Social History Narrative   Lives with wife at home   Caffeine - mostly decaff     PHYSICAL EXAM  GENERAL EXAM/CONSTITUTIONAL: Vitals:  Filed Vitals:   11/25/15 0925  BP: 106/63  Pulse: 63  Height: 5' 9.5" (1.765 m)  Weight: 197 lb 3.2 oz (89.449 kg)      Body mass index is 28.71 kg/(m^2).  Visual Acuity Screening   Right eye Left eye Both eyes  Without correction:     With correction: 20/30 20/30      Patient is in no distress; well developed, nourished and groomed; neck is supple  CARDIOVASCULAR:  Examination of carotid arteries is normal; no carotid bruits  Regular rate and rhythm, no murmurs  Examination of peripheral vascular system by observation and palpation is normal  EYES:  Ophthalmoscopic exam of optic discs and posterior segments is normal; no papilledema or hemorrhages  MUSCULOSKELETAL:  Gait, strength, tone, movements noted in Neurologic exam below  NEUROLOGIC: MENTAL STATUS:  No flowsheet data found.  awake, alert, oriented to person, place and time  recent and remote memory intact  normal attention and concentration  language fluent, comprehension intact, naming intact,   fund of knowledge appropriate  CRANIAL NERVE:   2nd - no papilledema on fundoscopic exam  2nd, 3rd, 4th, 6th - pupils equal and reactive to light, visual fields full to confrontation, extraocular muscles intact, no nystagmus  5th - facial sensation symmetric  7th - facial strength symmetric  8th - hearing intact  9th - palate elevates symmetrically, uvula midline  11th - shoulder shrug symmetric  12th - tongue protrusion midline  MOTOR:   normal bulk and tone, full strength in the BUE, BLE  SENSORY:   normal and symmetric to light touch, temperature, vibration  COORDINATION:   finger-nose-finger, fine finger movements normal  REFLEXES:   deep tendon reflexes present and symmetric  GAIT/STATION:   narrow based gait; able to walk on toes, heels and tandem; romberg is negative    DIAGNOSTIC DATA (LABS, IMAGING, TESTING) - I reviewed patient records, labs, notes, testing and imaging myself where available.  Lab Results  Component Value Date   WBC 5.8 08/12/2013   HGB 12.4* 08/12/2013   HCT  36.1* 08/12/2013   MCV 89.1 08/12/2013   PLT 246 08/12/2013      Component Value Date/Time   NA 140 08/12/2013 1015   K 3.9 08/12/2013 1015   CL 104 08/12/2013 1015   CO2 23 08/12/2013 1015   GLUCOSE 105* 08/12/2013 1015   BUN 22 08/12/2013 1015   CREATININE 1.76* 08/12/2013 1015   CALCIUM 9.5 08/12/2013 1015   PROT 7.0 08/12/2013 1015   ALBUMIN 4.1 08/12/2013 1015   AST 34 08/12/2013 1015   ALT 29 08/12/2013 1015   ALKPHOS 51 08/12/2013 1015   BILITOT 0.3 08/12/2013 1015   GFRNONAA 38* 08/12/2013 1015   GFRAA 44* 08/12/2013 1015   No results found for: CHOL, HDL, LDLCALC, LDLDIRECT, TRIG, CHOLHDL No results found for: HGBA1C No results found for: VITAMINB12 No results found for: TSH   08/12/13 EKG [  I reviewed images myself and agree with interpretation. -VRP]  - sinus rhythm - no STEMI  08/12/13 CT head [I reviewed images myself and agree with interpretation; although left basal ganglia finding could be benign infrapuitaminal cyst. -VRP]  1. No acute intracranial abnormality. 2. Mild cerebral and cerebellar volume loss. 3. Remote left basal ganglia lacunar infarct.  11/11/15 carotid u/s  - No significant carotid plaque or stenosis.     ASSESSMENT AND PLAN  70 y.o. year old male here with intermittent episodes of dizziness sensation, with head spinning and visual processing difficulty attacks. Differential diagnosis includes TIA, seizure, presyncope. Will check further evaluation.   Ddx: TIA vs seizures vs cardiac/cardiogenic pre-syncope  1. Dizziness and giddiness   2. Near syncope   3. Transient cerebral ischemia, unspecified transient cerebral ischemia type      PLAN: - continue aspirin 81mg  daily - check MRI brain, MRA head, MRA neck - check echocardiogram (TTE) - check EEG - risk factor reduction per PCP (lipids and BP)  Orders Placed This Encounter  Procedures  . MR Brain Wo Contrast  . MR MRA HEAD WO CONTRAST  . MR Angiogram Neck W Wo Contrast   . TSH  . Hemoglobin A1c  . BUN  . Creatinine, Serum  . ECHO COMPLETE  . EEG adult   Return in about 6 weeks (around 01/06/2016).  I reviewed images, labs, notes, records myself. I summarized findings and reviewed with patient, for this high risk condition (TIA vs seizure vs other spell) requiring high complexity decision making.     Penni Bombard, MD [id number], [id number] AM Certified in Neurology, Neurophysiology and Neuroimaging  [facility] [address] [place], [place] [address] [phone]

## 2015-11-25 NOTE — Patient Instructions (Signed)
Thank you for coming to see [place] at [facility]. I hope we have been able to provide you high quality care today.  You may receive a patient satisfaction survey over the next few weeks. We would appreciate your feedback and comments so that we may continue to improve ourselves and the health of our patients.  - continue aspirin '81mg'$  daily - check MRI brain, MRA head, MRA neck - check echocardiogram (TTE) - check EEG - risk factor reduction per PCP (lipids and BP)   ~~~~~~~~~~~~~~~~~~~~~~~~~~~~~~~~~~~~~~~~~~~~~~~~~~~~~~~~~~~~~~~~~  DR. PENUMALLI'S GUIDE TO HAPPY AND HEALTHY LIVING These are some of my general health and wellness recommendations. Some of them may apply to you better than others. Please use common sense as you try these suggestions and feel free to ask me any questions.   ACTIVITY/FITNESS Mental, social, emotional and physical stimulation are very important for brain and body health. Try learning a new activity (arts, music, language, sports, games).  Keep moving your body to the best of your abilities. You can do this at home, inside or outside, the park, community center, gym or anywhere you like. Consider a physical therapist or personal trainer to get started. Consider the app Sworkit. Fitness trackers such as smart-watches, smart-phones or Fitbits can help as well.   NUTRITION Eat more plants: colorful vegetables, nuts, seeds and berries.  Eat less sugar, salt, preservatives and processed foods.  Avoid toxins such as cigarettes and alcohol.  Drink water when you are thirsty. Warm water with a slice of lemon is an excellent morning drink to start the day.  Consider these websites for more information The Nutrition Source (https://www.henry-hernandez.biz/) Precision Nutrition (WindowBlog.ch)   RELAXATION Consider practicing mindfulness meditation or other relaxation techniques such as deep breathing,  prayer, yoga, tai chi, massage. See website mindful.org or the apps Headspace or Calm to help get started.   SLEEP Try to get at least 7-8+ hours sleep per day. Regular exercise and reduced caffeine will help you sleep better. Practice good sleep hygeine techniques. See website sleep.org for more information.   PLANNING Prepare estate planning, living will, healthcare POA documents. Sometimes this is best planned with the help of an attorney. Theconversationproject.org and agingwithdignity.org are excellent resources.

## 2015-11-26 ENCOUNTER — Telehealth: Payer: Self-pay | Admitting: *Deleted

## 2015-11-26 LAB — CREATININE, SERUM
CREATININE: 1.62 mg/dL — AB (ref 0.76–1.27)
GFR calc Af Amer: 49 mL/min/{1.73_m2} — ABNORMAL LOW (ref >59)
GFR calc non Af Amer: 43 mL/min/{1.73_m2} — ABNORMAL LOW (ref >59)

## 2015-11-26 LAB — BUN: BUN: 18 mg/dL (ref 8–27)

## 2015-11-26 LAB — HEMOGLOBIN A1C
Est. average glucose Bld gHb Est-mCnc: 111 mg/dL
Hgb A1c MFr Bld: 5.5 % (ref 4.8–5.6)

## 2015-11-26 LAB — TSH: TSH: 2.38 u[IU]/mL (ref 0.450–4.500)

## 2015-11-26 NOTE — Telephone Encounter (Signed)
Spoke with patient and informed him that lab results are unremarkable. He inquired to his creatinine level; gave him the result #. He stated "That is much better than it was when I was diagnosed." he verbalized understanding, appreciation of call.

## 2015-12-05 ENCOUNTER — Ambulatory Visit
Admission: RE | Admit: 2015-12-05 | Discharge: 2015-12-05 | Disposition: A | Discharge status: Home / Self Care | Payer: Medicare Other | Source: Ambulatory Visit | Attending: Diagnostic Neuroimaging | Admitting: Diagnostic Neuroimaging

## 2015-12-05 ENCOUNTER — Other Ambulatory Visit: Payer: Self-pay | Admitting: Diagnostic Neuroimaging

## 2015-12-05 DIAGNOSIS — G459 Transient cerebral ischemic attack, unspecified: Secondary | ICD-10-CM

## 2015-12-05 DIAGNOSIS — R55 Syncope and collapse: Secondary | ICD-10-CM

## 2015-12-05 DIAGNOSIS — R42 Dizziness and giddiness: Secondary | ICD-10-CM

## 2015-12-05 DIAGNOSIS — Z01818 Encounter for other preprocedural examination: Secondary | ICD-10-CM | POA: Diagnosis not present

## 2015-12-05 DIAGNOSIS — Z77018 Contact with and (suspected) exposure to other hazardous metals: Secondary | ICD-10-CM

## 2015-12-05 MED ORDER — GADOBENATE DIMEGLUMINE 529 MG/ML IV SOLN
19.0000 mL | Freq: Once | INTRAVENOUS | Status: AC | PRN
  Administered 2015-12-05: 19 mL via INTRAVENOUS

## 2015-12-15 ENCOUNTER — Other Ambulatory Visit (HOSPITAL_COMMUNITY): Payer: Medicare Other

## 2015-12-22 ENCOUNTER — Other Ambulatory Visit (HOSPITAL_COMMUNITY): Payer: Self-pay

## 2015-12-22 ENCOUNTER — Ambulatory Visit (HOSPITAL_COMMUNITY): Payer: Medicare Other | Attending: Internal Medicine

## 2015-12-22 DIAGNOSIS — R55 Syncope and collapse: Secondary | ICD-10-CM | POA: Diagnosis not present

## 2015-12-22 DIAGNOSIS — R42 Dizziness and giddiness: Secondary | ICD-10-CM

## 2015-12-22 DIAGNOSIS — Z87891 Personal history of nicotine dependence: Secondary | ICD-10-CM | POA: Diagnosis not present

## 2015-12-22 DIAGNOSIS — G459 Transient cerebral ischemic attack, unspecified: Secondary | ICD-10-CM

## 2015-12-22 DIAGNOSIS — E785 Hyperlipidemia, unspecified: Secondary | ICD-10-CM | POA: Diagnosis not present

## 2015-12-22 DIAGNOSIS — I351 Nonrheumatic aortic (valve) insufficiency: Secondary | ICD-10-CM | POA: Diagnosis not present

## 2015-12-22 LAB — ECHOCARDIOGRAM COMPLETE
AVPHT: 1172 ms
Ao-asc: 33 cm
E decel time: 232 msec
E/e' ratio: 13.25
FS: 37 % (ref 28–44)
IV/PV OW: 0.7
LA ID, A-P, ES: 36 mm
LA diam end sys: 36 mm
LA diam index: 1.7 cm/m2
LA vol index: 10.9 mL/m2
LA vol: 23 mL
LAVOLA4C: 28 mL
LV E/e'average: 13.25
LV e' LATERAL: 5.7 cm/s
LVEEMED: 13.25
LVOT VTI: 29.2 cm
LVOT area: 4.15 cm2
LVOT diameter: 23 mm
LVOT peak grad rest: 7 mmHg
LVOT peak vel: 131 cm/s
LVOTSV: 121 mL
Lateral S' vel: 10.6 cm/s
MV Dec: 232
MVPG: 2 mmHg
MVPKAVEL: 89.3 m/s
MVPKEVEL: 75.5 m/s
PW: 9.03 mm — AB (ref 0.6–1.1)
TDI e' lateral: 5.7
TDI e' medial: 5.92

## 2015-12-25 ENCOUNTER — Telehealth: Payer: Self-pay | Admitting: *Deleted

## 2015-12-25 ENCOUNTER — Ambulatory Visit (INDEPENDENT_AMBULATORY_CARE_PROVIDER_SITE_OTHER): Payer: Medicare Other | Admitting: Diagnostic Neuroimaging

## 2015-12-25 DIAGNOSIS — G459 Transient cerebral ischemic attack, unspecified: Secondary | ICD-10-CM

## 2015-12-25 DIAGNOSIS — R55 Syncope and collapse: Secondary | ICD-10-CM | POA: Diagnosis not present

## 2015-12-25 DIAGNOSIS — R42 Dizziness and giddiness: Secondary | ICD-10-CM

## 2015-12-25 NOTE — Telephone Encounter (Signed)
Per Dr Leta Baptist, called patient and LVM informing him echocardiogram results are unremarkable, no major findings, continue with current treatment plan and medications. Reminded him of FU in Aug. Left name, number.

## 2015-12-30 NOTE — Procedures (Signed)
   [facility]  EEG (ELECTROENCEPHALOGRAM) REPORT   STUDY DATE: 12/25/15 PATIENT NAME: Phillip Alvarado DOB: 08-Apr-1946 MRN: [id number]  ORDERING CLINICIAN: Andrey Spearman, MD   TECHNOLOGIST: Laretta Alstrom  TECHNIQUE: Electroencephalogram was recorded utilizing standard 10-20 system of lead placement and reformatted into average and bipolar montages.  RECORDING TIME: 31 minutes ACTIVATION: hyperventilation and photic stimulation  CLINICAL INFORMATION: 70 year old male with dizzy spells and pre-syncope. Evaluate for seizures.  FINDINGS: Background rhythms of 9-10 hertz and 50-60 microvolts. No focal, lateralizing, epileptiform activity or seizures are seen. Patient recorded in the awake and drowsy state. EKG channel shows regular rhythm of 60 beats per minute.  IMPRESSION:  Normal EEG in the awake and drowsy states.    INTERPRETING PHYSICIAN:  Penni Bombard, MD Certified in Neurology, Neurophysiology and Neuroimaging  [facility] [address], [place], [place] [address] [phone]

## 2015-12-31 ENCOUNTER — Telehealth: Payer: Self-pay | Admitting: *Deleted

## 2015-12-31 NOTE — Telephone Encounter (Signed)
Per Dr Leta Baptist, LVM for patient informing him his EEG results are normal. Reminded him of his FU 01/06/16, 2:20 pm at which time Dr Leta Baptist will further discuss his tests. Continue with current treatment plan at this time. Left name, number.

## 2016-01-06 ENCOUNTER — Encounter: Payer: Self-pay | Admitting: Diagnostic Neuroimaging

## 2016-01-06 ENCOUNTER — Ambulatory Visit (INDEPENDENT_AMBULATORY_CARE_PROVIDER_SITE_OTHER): Payer: Medicare Other | Admitting: Diagnostic Neuroimaging

## 2016-01-06 VITALS — BP 100/66 | HR 56 | Wt 200.0 lb

## 2016-01-06 DIAGNOSIS — R42 Dizziness and giddiness: Secondary | ICD-10-CM | POA: Diagnosis not present

## 2016-01-06 DIAGNOSIS — R6889 Other general symptoms and signs: Secondary | ICD-10-CM

## 2016-01-06 DIAGNOSIS — R55 Syncope and collapse: Secondary | ICD-10-CM

## 2016-01-06 NOTE — Progress Notes (Signed)
[facility]  PATIENT: Phillip Alvarado DOB: 12/19/1945   REFERRING CLINICIAN: Olen Pel, S HISTORY FROM: patient  REASON FOR VISIT: follow up   HISTORICAL  CHIEF COMPLAINT:  Chief Complaint  Patient presents with  . Dizziness    rm 6, " no dizzy episodes in past 6 weeks", review results of tests  . Follow-up    6 weeks    HISTORY OF PRESENT ILLNESS:   UPDATE 01/06/16: Since last visit, doing well. No further attacks. Testing completed and reviewed.   PRIOR HPI (11/25/15): 70 year old male here for evaluation of dizzy spells. Patient has history of hypertension, hypercholesteremia, kidney disease. Over past 4 months patient has had 4 events of "head spinning" sensation and difficulty with "vision processing". 3 episodes occurred while driving his car at nighttime. Episodes lasted 1 minute each. Patient was slightly concerned about his driving ability, but was able to continue driving without difficulty. Last event occurred on 11/06/2015. In addition one episode occurred during his sleep" woke him up" and he felt a spinning sensation in his head. 2012 patient had episode of possible benign positional vertigo where he had room spinning sensation, lasting for a few days, possibly triggered by position changes. No nausea vomiting. No vision changes. Patient tried Epley maneuvers at that time and symptoms resolved on their own. 2015 patient was standing at church, felt hot lightheaded and passed out. He stood up after initial passing out and passed out a second time. EMS was called and blood pressure was noted to be 88/58. Patient also had episode of passing out as a teenager when he stood up too fast and passed out. No focal numbness, weakness, slurred speech, facial droop, unilateral vision loss. No significant headaches, chest pain, shortness of breath.   REVIEW OF SYSTEMS: Full 14 system review of systems performed and negative with exception of:  Dizziness.  ALLERGIES: Allergies  Allergen Reactions  . Statins Other (See Comments)    Muscle aches Lipitor Gemfibrozil    HOME MEDICATIONS: Outpatient Medications Prior to Visit  Medication Sig Dispense Refill  . aspirin 81 MG tablet Take 81 mg by mouth daily.    Marland Kitchen atenolol (TENORMIN) 25 MG tablet Take 25 mg by mouth daily.    Marland Kitchen doxazosin (CARDURA) 2 MG tablet Take 2 mg by mouth at bedtime.    . fenofibrate (TRICOR) 145 MG tablet Take 145 mg by mouth daily.    . lansoprazole (PREVACID) 15 MG capsule Take 15 mg by mouth daily at 12 noon.    Marland Kitchen levothyroxine (SYNTHROID, LEVOTHROID) 50 MCG tablet Take 50 mcg by mouth daily.    . polyethylene glycol (MIRALAX / GLYCOLAX) packet Take 17 g by mouth daily.     No facility-administered medications prior to visit.     PAST MEDICAL HISTORY: Past Medical History:  Diagnosis Date  . Chronic kidney disease   . GERD (gastroesophageal reflux disease)   . Hyperlipidemia   . Hypertension   . Hypothyroidism     PAST SURGICAL HISTORY: Past Surgical History:  Procedure Laterality Date  . APPENDECTOMY  1965  . FOOT FUSION  1995  . HERNIA REPAIR  2005    FAMILY HISTORY: Family History  Problem Relation Age of Onset  . Diabetes Brother   . Heart disease Brother     Before age 102    SOCIAL HISTORY:  Social History   Social History  . Marital status: Married    Spouse name: Izora Gala   . Number of children:  2  . Years of education: 3   Occupational History  . 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 Smoking status: Former Smoker    Quit date: 12/06/1978  . Smokeless tobacco: Never Used  . Alcohol use 0.0 oz/week     Comment: 1 per month  . Drug use: No  . Sexual activity: Not on file   Other Topics Concern  . Not on file   Social History Narrative   Lives with wife at home   Caffeine - mostly decaff     PHYSICAL EXAM  GENERAL EXAM/CONSTITUTIONAL: Vitals:  Vitals:   01/06/16 1410  BP: 100/66   Pulse: (!) 56  Weight: 200 lb (90.7 kg)   Body mass index is 29.11 kg/m. No exam data present  Patient is in no distress; well developed, nourished and groomed; neck is supple  CARDIOVASCULAR:  Examination of carotid arteries is normal; no carotid bruits  Regular rate and rhythm, no murmurs  Examination of peripheral vascular system by observation and palpation is normal  EYES:  Ophthalmoscopic exam of optic discs and posterior segments is normal; no papilledema or hemorrhages  MUSCULOSKELETAL:  Gait, strength, tone, movements noted in Neurologic exam below  NEUROLOGIC: MENTAL STATUS:  No flowsheet data found.  awake, alert, oriented to person, place and time  recent and remote memory intact  normal attention and concentration  language fluent, comprehension intact, naming intact,   fund of knowledge appropriate  CRANIAL NERVE:   2nd - no papilledema on fundoscopic exam  2nd, 3rd, 4th, 6th - pupils equal and reactive to light, visual fields full to confrontation, extraocular muscles intact, no nystagmus  5th - facial sensation symmetric  7th - facial strength symmetric  8th - hearing intact  9th - palate elevates symmetrically, uvula midline  11th - shoulder shrug symmetric  12th - tongue protrusion midline  MOTOR:   normal bulk and tone, full strength in the BUE, BLE  SENSORY:   normal and symmetric to light touch, temperature, vibration  COORDINATION:   finger-nose-finger, fine finger movements normal  REFLEXES:   deep tendon reflexes present and symmetric  GAIT/STATION:   narrow based gait; romberg is negative    DIAGNOSTIC DATA (LABS, IMAGING, TESTING) - I reviewed patient records, labs, notes, testing and imaging myself where available.  Lab Results  Component Value Date   WBC 5.8 08/12/2013   HGB 12.4 (L) 08/12/2013   HCT 36.1 (L) 08/12/2013   MCV 89.1 08/12/2013   PLT 246 08/12/2013      Component Value Date/Time    NA 140 08/12/2013 1015   K 3.9 08/12/2013 1015   CL 104 08/12/2013 1015   CO2 23 08/12/2013 1015   GLUCOSE 105 (H) 08/12/2013 1015   BUN 18 11/25/2015 1124   CREATININE 1.62 (H) 11/25/2015 1124   CALCIUM 9.5 08/12/2013 1015   PROT 7.0 08/12/2013 1015   ALBUMIN 4.1 08/12/2013 1015   AST 34 08/12/2013 1015   ALT 29 08/12/2013 1015   ALKPHOS 51 08/12/2013 1015   BILITOT 0.3 08/12/2013 1015   GFRNONAA 43 (L) 11/25/2015 1124   GFRAA 49 (L) 11/25/2015 1124   No results found for: CHOL, HDL, LDLCALC, LDLDIRECT, TRIG, CHOLHDL Lab Results  Component Value Date   HGBA1C 5.5 11/25/2015   No results found for: VITAMINB12 Lab Results  Component Value Date   TSH 2.380 11/25/2015     08/12/13 EKG [I reviewed images myself and agree with interpretation. -  [facility]P]  - sinus rhythm - no STEMI  08/12/13 CT head [I reviewed images myself and agree with interpretation; although left basal ganglia finding could be benign infrapuitaminal cyst. -VRP]  1. No acute intracranial abnormality. 2. Mild cerebral and cerebellar volume loss. 3. Remote left basal ganglia lacunar infarct.  11/11/15 carotid u/s  - No significant carotid plaque or stenosis.  12/05/15 MRI brain [I reviewed images myself and agree with interpretation. -VRP]  1.   Few T2/FLAIR hyperintense foci in the subcortical and deep white matter of the left frontal lobe with minimal chronic microvascular ischemic change, normal for age. 2.   Minimal cortical atrophy within a normal range for age. 3.   The sella turcica is mildly enlarged and pituitary tissue is thinned, consistent with a "partially empty sella". This is a nonspecific finding and could be idiopathic or due to increased intracranial pressure.  12/05/15 MRA head [I reviewed images myself and agree with interpretation. -VRP] 1.    Minimal nonhemodynamically significant stenosis within the P2 segment of the right posterior cerebral artery. 2.    There is normal variant anatomy with  the right vertebral artery terminating as a right posterior inferior cerebellar artery and the right posterior cerebral artery obtaining its flow from the anterior circulation through the right posterior communicating artery.  12/05/15 MRA neck [I reviewed images myself and agree with interpretation. -VRP]  1.     There does not appear to be any hemodynamically significant stenosis in the vertebral or carotid arteries.  A focus of possible nonhemodynamically significant stenosis in the right vertebral artery on reconstructed images is not seen on the source images and thus could represent artifact. 2.     The left vertebral artery is dominant and the right vertebral artery terminates as the posterior inferior cerebellar artery, a normal variant.  12/30/15 EEG  - normal  12/22/15 TTE - Left ventricle: The cavity size was normal. Wall thickness was   normal. Systolic function was normal. The estimated ejection   fraction was in the range of 60% to 65%. Doppler parameters are   consistent with abnormal left ventricular relaxation (grade 1   diastolic dysfunction). - Aortic valve: There was mild regurgitation.    ASSESSMENT AND PLAN  70 y.o. year old male here with intermittent episodes of dizziness sensation, with head spinning and visual processing difficulty attacks. Differential diagnosis includes TIA, seizure, presyncope. Now doing well.    Ddx: TIA vs seizures vs cardiac/cardiogenic pre-syncope  1. Dizziness and giddiness   2. Near syncope   3. Other general symptoms and signs      PLAN: - continue aspirin 81mg  daily - risk factor reduction per PCP (lipids and BP) - general health advice reviewed - follow up with PCP re: possible cardiology workup and heart monitoring  Return if symptoms worsen or fail to improve, for return to PCP.    Penni Bombard, MD [id number], [id number] PM Certified in Neurology, Neurophysiology and Neuroimaging  [facility] [address] [place], [place] [address] [phone]

## 2016-01-06 NOTE — Patient Instructions (Signed)
-   continue current medications  - monitor symptoms

## 2016-05-21 DIAGNOSIS — H6120 Impacted cerumen, unspecified ear: Secondary | ICD-10-CM | POA: Diagnosis not present

## 2016-05-21 DIAGNOSIS — Z23 Encounter for immunization: Secondary | ICD-10-CM | POA: Diagnosis not present

## 2016-06-07 DIAGNOSIS — N182 Chronic kidney disease, stage 2 (mild): Secondary | ICD-10-CM | POA: Diagnosis not present

## 2016-06-10 DIAGNOSIS — Z6829 Body mass index (BMI) 29.0-29.9, adult: Secondary | ICD-10-CM | POA: Diagnosis not present

## 2016-06-10 DIAGNOSIS — E7800 Pure hypercholesterolemia, unspecified: Secondary | ICD-10-CM | POA: Diagnosis not present

## 2016-06-10 DIAGNOSIS — E039 Hypothyroidism, unspecified: Secondary | ICD-10-CM | POA: Diagnosis not present

## 2016-06-10 DIAGNOSIS — M79662 Pain in left lower leg: Secondary | ICD-10-CM | POA: Diagnosis not present

## 2016-06-10 DIAGNOSIS — N400 Enlarged prostate without lower urinary tract symptoms: Secondary | ICD-10-CM | POA: Diagnosis not present

## 2016-06-10 DIAGNOSIS — M79661 Pain in right lower leg: Secondary | ICD-10-CM | POA: Diagnosis not present

## 2016-06-10 DIAGNOSIS — I10 Essential (primary) hypertension: Secondary | ICD-10-CM | POA: Diagnosis not present

## 2016-06-10 DIAGNOSIS — N183 Chronic kidney disease, stage 3 (moderate): Secondary | ICD-10-CM | POA: Diagnosis not present

## 2016-07-06 IMAGING — US US CAROTID DUPLEX BILAT
1 series · 13 of 24 positions shown · non-contrast
Comparison: None.

CLINICAL DATA: Dizziness, visual disturbance

EXAM:
BILATERAL CAROTID DUPLEX ULTRASOUND
TECHNIQUE: Gray scale imaging, color Doppler and duplex ultrasound was
performed of bilateral carotid and vertebral arteries in the neck.
TECHNIQUE: Quantification of carotid stenosis is based on velocity parameters
that correlate the residual internal carotid diameter with
NASCET-based stenosis levels, using the diameter of the distal
internal carotid lumen as the denominator for stenosis measurement.

[Series 1: us carotid duplex bilat · 0.07mm/px · 13 of 66 slices shown]
[im 1/66]
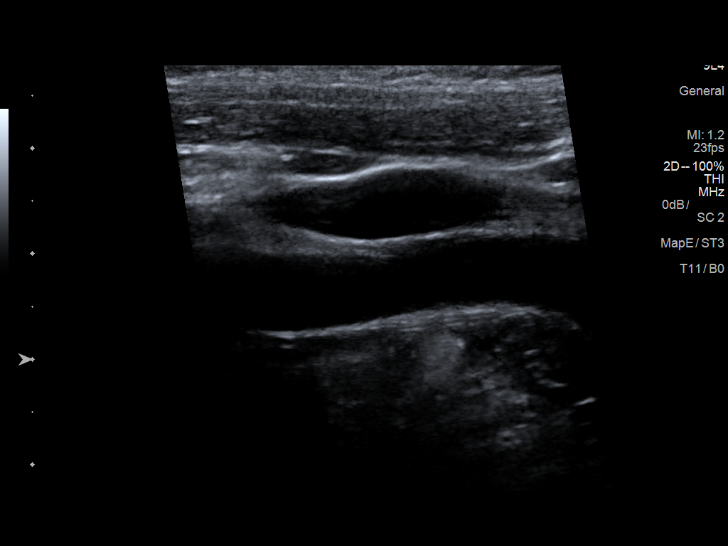
[im 6/66]
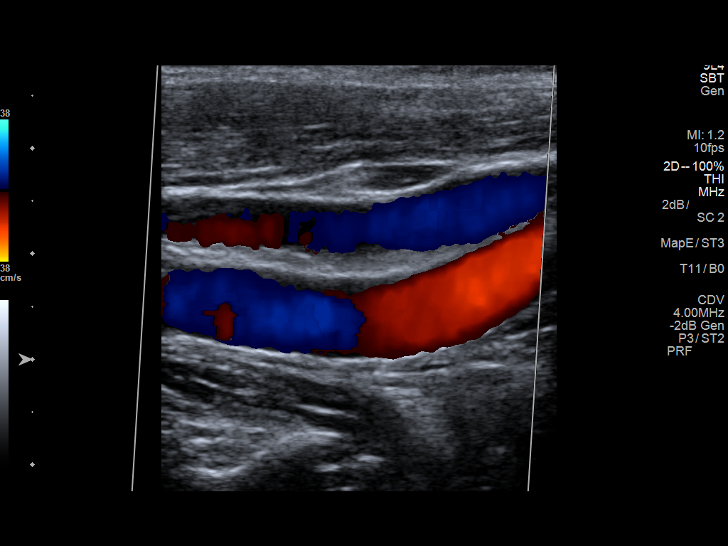
[im 12/66]
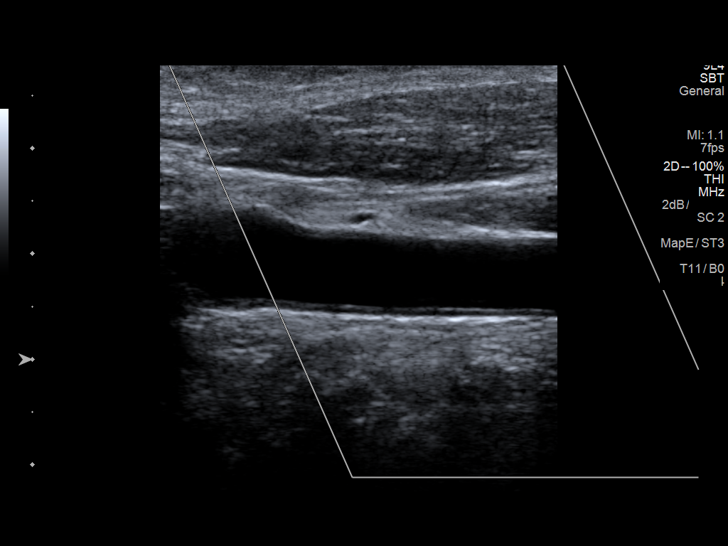
[im 17/66]
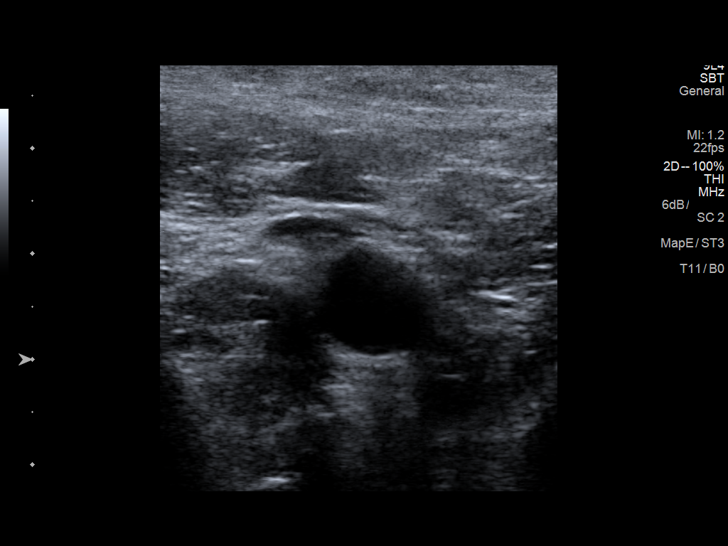
[im 23/66]
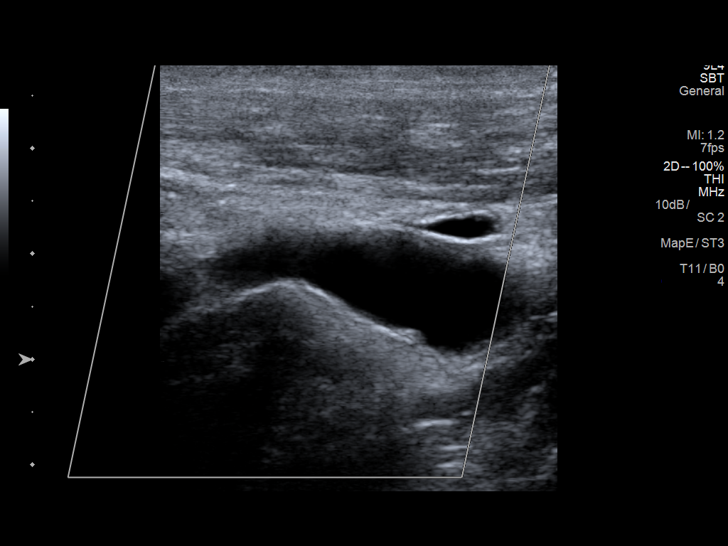
[im 29/66]
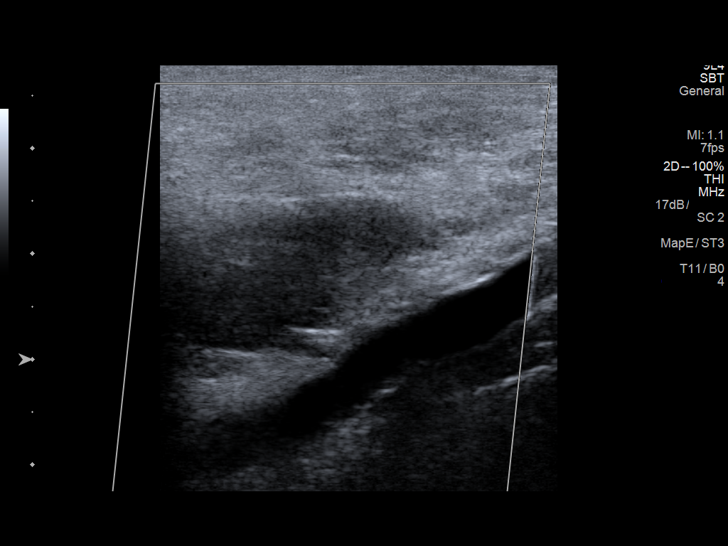
[im 34/66]
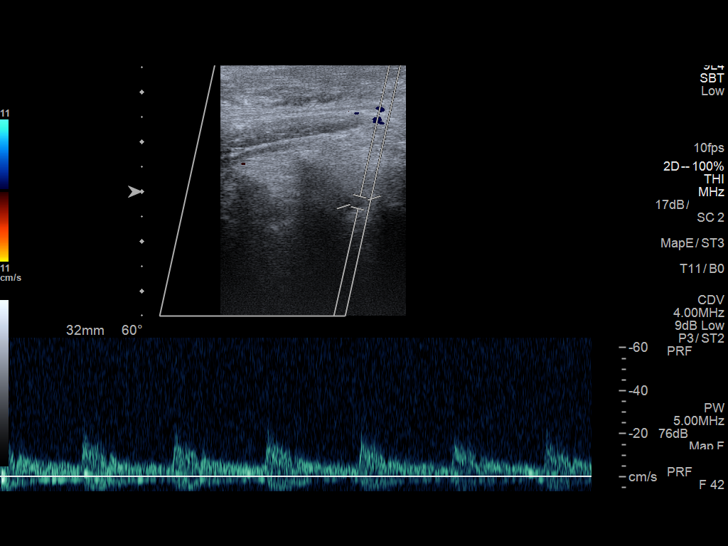
[im 37/66]
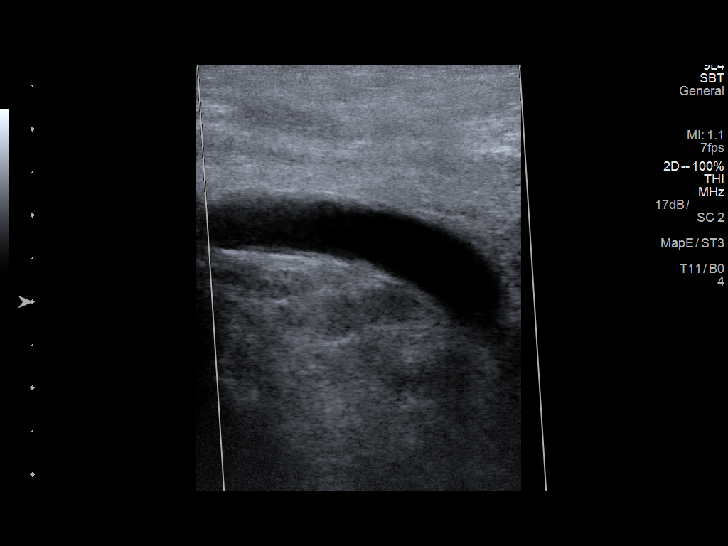
[im 43/66]
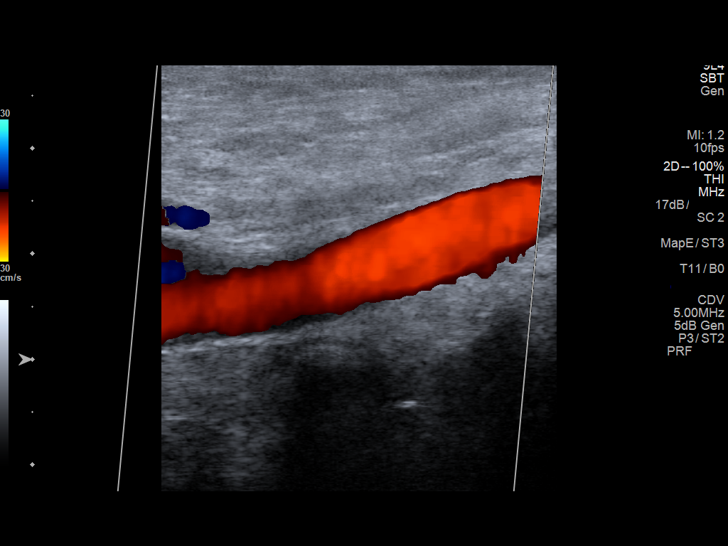
[im 49/66]
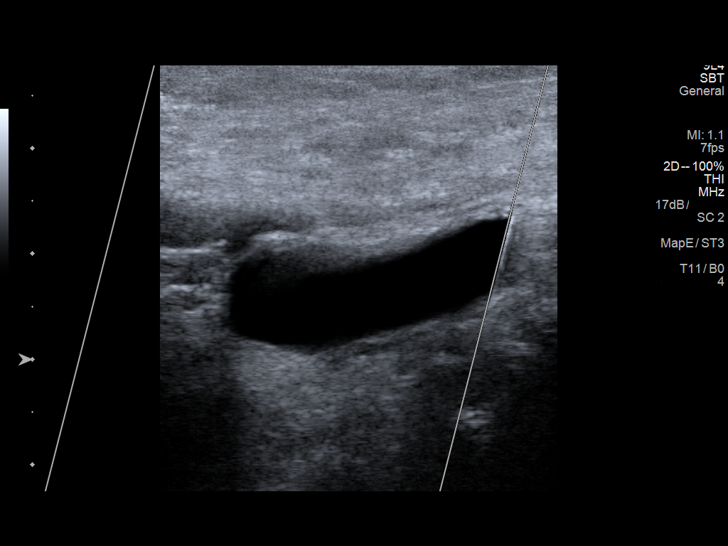
[im 54/66]
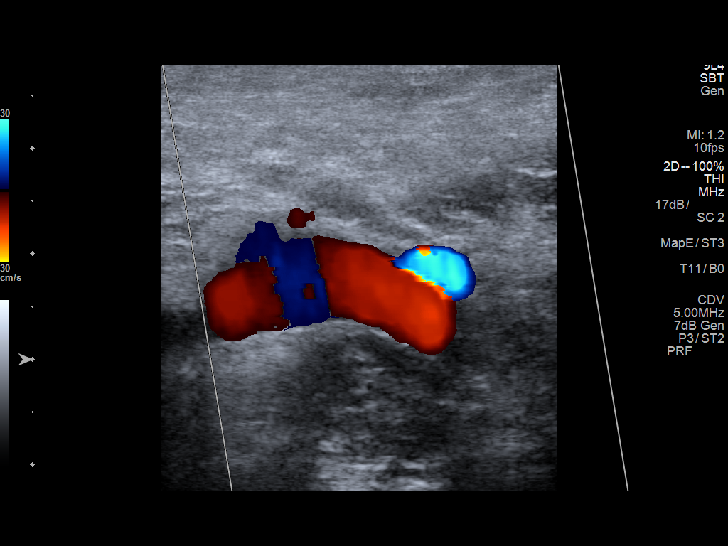
[im 60/66]
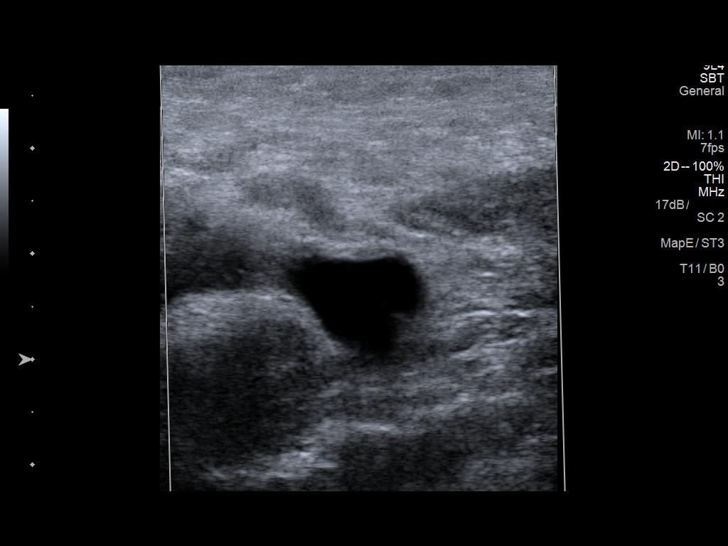
[im 66/66]
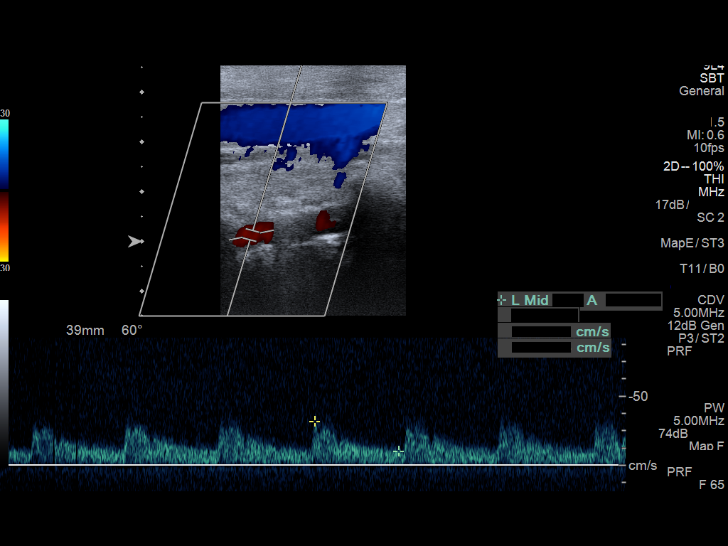

[13 of 24 positions shown; findings below may reference images not displayed]

The following velocity measurements were obtained:

PEAK SYSTOLIC/END DIASTOLIC

RIGHT

ICA:                     69/25cm/sec

CCA:                     108/20cm/sec

SYSTOLIC ICA/CCA RATIO:

DIASTOLIC ICA/CCA RATIO:

ECA:                     70cm/sec

LEFT

ICA:                     85/22cm/sec

CCA:                     113/23cm/sec

SYSTOLIC ICA/CCA RATIO:

DIASTOLIC ICA/CCA RATIO:

ECA:                     51cm/sec
FINDINGS: RIGHT CAROTID ARTERY: No significant plaque deposition or stenosis.
Normal waveforms and color Doppler signal.

RIGHT VERTEBRAL ARTERY:  Normal flow direction and waveform.

LEFT CAROTID ARTERY: No significant plaque or stenosis. Normal
waveforms and color Doppler signal.

LEFT VERTEBRAL ARTERY: Normal flow direction and waveform.
IMPRESSION: 1. No significant carotid plaque or stenosis.

## 2016-10-28 DIAGNOSIS — Z1211 Encounter for screening for malignant neoplasm of colon: Secondary | ICD-10-CM | POA: Diagnosis not present

## 2016-10-28 DIAGNOSIS — Z125 Encounter for screening for malignant neoplasm of prostate: Secondary | ICD-10-CM | POA: Diagnosis not present

## 2016-10-28 DIAGNOSIS — E782 Mixed hyperlipidemia: Secondary | ICD-10-CM | POA: Diagnosis not present

## 2016-10-28 DIAGNOSIS — N183 Chronic kidney disease, stage 3 (moderate): Secondary | ICD-10-CM | POA: Diagnosis not present

## 2016-10-28 DIAGNOSIS — Z1159 Encounter for screening for other viral diseases: Secondary | ICD-10-CM | POA: Diagnosis not present

## 2016-10-28 DIAGNOSIS — Z0000 Encounter for general adult medical examination without abnormal findings: Secondary | ICD-10-CM | POA: Diagnosis not present

## 2016-10-28 DIAGNOSIS — E039 Hypothyroidism, unspecified: Secondary | ICD-10-CM | POA: Diagnosis not present

## 2016-10-28 DIAGNOSIS — N400 Enlarged prostate without lower urinary tract symptoms: Secondary | ICD-10-CM | POA: Diagnosis not present

## 2016-10-28 DIAGNOSIS — I10 Essential (primary) hypertension: Secondary | ICD-10-CM | POA: Diagnosis not present

## 2016-12-24 DIAGNOSIS — N182 Chronic kidney disease, stage 2 (mild): Secondary | ICD-10-CM | POA: Diagnosis not present

## 2017-02-04 DIAGNOSIS — Z6829 Body mass index (BMI) 29.0-29.9, adult: Secondary | ICD-10-CM | POA: Diagnosis not present

## 2017-02-04 DIAGNOSIS — N183 Chronic kidney disease, stage 3 (moderate): Secondary | ICD-10-CM | POA: Diagnosis not present

## 2017-02-04 DIAGNOSIS — E039 Hypothyroidism, unspecified: Secondary | ICD-10-CM | POA: Diagnosis not present

## 2017-02-04 DIAGNOSIS — I10 Essential (primary) hypertension: Secondary | ICD-10-CM | POA: Diagnosis not present

## 2017-02-04 DIAGNOSIS — N400 Enlarged prostate without lower urinary tract symptoms: Secondary | ICD-10-CM | POA: Diagnosis not present

## 2017-02-04 DIAGNOSIS — M79661 Pain in right lower leg: Secondary | ICD-10-CM | POA: Diagnosis not present

## 2017-02-04 DIAGNOSIS — E7800 Pure hypercholesterolemia, unspecified: Secondary | ICD-10-CM | POA: Diagnosis not present

## 2017-02-04 DIAGNOSIS — M79662 Pain in left lower leg: Secondary | ICD-10-CM | POA: Diagnosis not present

## 2017-06-20 DIAGNOSIS — N183 Chronic kidney disease, stage 3 (moderate): Secondary | ICD-10-CM | POA: Diagnosis not present

## 2017-09-08 DIAGNOSIS — I129 Hypertensive chronic kidney disease with stage 1 through stage 4 chronic kidney disease, or unspecified chronic kidney disease: Secondary | ICD-10-CM | POA: Diagnosis not present

## 2017-09-08 DIAGNOSIS — N183 Chronic kidney disease, stage 3 (moderate): Secondary | ICD-10-CM | POA: Diagnosis not present

## 2017-09-08 DIAGNOSIS — N400 Enlarged prostate without lower urinary tract symptoms: Secondary | ICD-10-CM | POA: Diagnosis not present

## 2017-09-08 DIAGNOSIS — Z6829 Body mass index (BMI) 29.0-29.9, adult: Secondary | ICD-10-CM | POA: Diagnosis not present

## 2017-09-08 DIAGNOSIS — E7800 Pure hypercholesterolemia, unspecified: Secondary | ICD-10-CM | POA: Diagnosis not present

## 2017-11-01 DIAGNOSIS — E039 Hypothyroidism, unspecified: Secondary | ICD-10-CM | POA: Diagnosis not present

## 2017-11-01 DIAGNOSIS — Z125 Encounter for screening for malignant neoplasm of prostate: Secondary | ICD-10-CM | POA: Diagnosis not present

## 2017-11-01 DIAGNOSIS — N183 Chronic kidney disease, stage 3 (moderate): Secondary | ICD-10-CM | POA: Diagnosis not present

## 2017-11-01 DIAGNOSIS — E782 Mixed hyperlipidemia: Secondary | ICD-10-CM | POA: Diagnosis not present

## 2017-11-01 DIAGNOSIS — I10 Essential (primary) hypertension: Secondary | ICD-10-CM | POA: Diagnosis not present

## 2017-11-01 DIAGNOSIS — N400 Enlarged prostate without lower urinary tract symptoms: Secondary | ICD-10-CM | POA: Diagnosis not present

## 2017-11-01 DIAGNOSIS — Z0000 Encounter for general adult medical examination without abnormal findings: Secondary | ICD-10-CM | POA: Diagnosis not present

## 2017-11-01 DIAGNOSIS — Z1211 Encounter for screening for malignant neoplasm of colon: Secondary | ICD-10-CM | POA: Diagnosis not present

## 2017-11-09 DIAGNOSIS — Z1211 Encounter for screening for malignant neoplasm of colon: Secondary | ICD-10-CM | POA: Diagnosis not present

## 2018-01-11 DIAGNOSIS — N183 Chronic kidney disease, stage 3 (moderate): Secondary | ICD-10-CM | POA: Diagnosis not present

## 2018-03-28 DIAGNOSIS — N183 Chronic kidney disease, stage 3 (moderate): Secondary | ICD-10-CM | POA: Diagnosis not present

## 2018-03-28 DIAGNOSIS — R1031 Right lower quadrant pain: Secondary | ICD-10-CM | POA: Diagnosis not present

## 2018-03-28 DIAGNOSIS — K4090 Unilateral inguinal hernia, without obstruction or gangrene, not specified as recurrent: Secondary | ICD-10-CM | POA: Diagnosis not present

## 2018-04-10 DIAGNOSIS — Z23 Encounter for immunization: Secondary | ICD-10-CM | POA: Diagnosis not present

## 2018-04-18 DIAGNOSIS — K625 Hemorrhage of anus and rectum: Secondary | ICD-10-CM | POA: Diagnosis not present

## 2018-04-24 DIAGNOSIS — K219 Gastro-esophageal reflux disease without esophagitis: Secondary | ICD-10-CM | POA: Diagnosis not present

## 2018-04-24 DIAGNOSIS — R1031 Right lower quadrant pain: Secondary | ICD-10-CM | POA: Diagnosis not present

## 2018-04-24 DIAGNOSIS — R195 Other fecal abnormalities: Secondary | ICD-10-CM | POA: Diagnosis not present

## 2018-05-01 DIAGNOSIS — K5730 Diverticulosis of large intestine without perforation or abscess without bleeding: Secondary | ICD-10-CM | POA: Diagnosis not present

## 2018-05-01 DIAGNOSIS — R195 Other fecal abnormalities: Secondary | ICD-10-CM | POA: Diagnosis not present

## 2018-05-01 DIAGNOSIS — D123 Benign neoplasm of transverse colon: Secondary | ICD-10-CM | POA: Diagnosis not present

## 2018-05-01 DIAGNOSIS — D125 Benign neoplasm of sigmoid colon: Secondary | ICD-10-CM | POA: Diagnosis not present

## 2018-05-01 DIAGNOSIS — R1031 Right lower quadrant pain: Secondary | ICD-10-CM | POA: Diagnosis not present

## 2018-05-01 DIAGNOSIS — D122 Benign neoplasm of ascending colon: Secondary | ICD-10-CM | POA: Diagnosis not present

## 2018-05-01 DIAGNOSIS — K640 First degree hemorrhoids: Secondary | ICD-10-CM | POA: Diagnosis not present

## 2018-05-01 DIAGNOSIS — D124 Benign neoplasm of descending colon: Secondary | ICD-10-CM | POA: Diagnosis not present

## 2018-05-03 DIAGNOSIS — D125 Benign neoplasm of sigmoid colon: Secondary | ICD-10-CM | POA: Diagnosis not present

## 2018-05-03 DIAGNOSIS — D122 Benign neoplasm of ascending colon: Secondary | ICD-10-CM | POA: Diagnosis not present

## 2018-05-03 DIAGNOSIS — D124 Benign neoplasm of descending colon: Secondary | ICD-10-CM | POA: Diagnosis not present

## 2018-05-03 DIAGNOSIS — D123 Benign neoplasm of transverse colon: Secondary | ICD-10-CM | POA: Diagnosis not present

## 2018-06-08 DIAGNOSIS — R1030 Lower abdominal pain, unspecified: Secondary | ICD-10-CM | POA: Diagnosis not present

## 2018-06-08 DIAGNOSIS — K4090 Unilateral inguinal hernia, without obstruction or gangrene, not specified as recurrent: Secondary | ICD-10-CM | POA: Diagnosis not present

## 2018-06-08 DIAGNOSIS — R195 Other fecal abnormalities: Secondary | ICD-10-CM | POA: Diagnosis not present

## 2018-06-28 DIAGNOSIS — I129 Hypertensive chronic kidney disease with stage 1 through stage 4 chronic kidney disease, or unspecified chronic kidney disease: Secondary | ICD-10-CM | POA: Diagnosis not present

## 2018-06-28 DIAGNOSIS — D631 Anemia in chronic kidney disease: Secondary | ICD-10-CM | POA: Diagnosis not present

## 2018-06-28 DIAGNOSIS — N183 Chronic kidney disease, stage 3 (moderate): Secondary | ICD-10-CM | POA: Diagnosis not present

## 2018-06-28 DIAGNOSIS — E039 Hypothyroidism, unspecified: Secondary | ICD-10-CM | POA: Diagnosis not present

## 2018-06-28 DIAGNOSIS — N189 Chronic kidney disease, unspecified: Secondary | ICD-10-CM | POA: Diagnosis not present

## 2018-06-28 DIAGNOSIS — E7800 Pure hypercholesterolemia, unspecified: Secondary | ICD-10-CM | POA: Diagnosis not present

## 2018-06-28 DIAGNOSIS — N400 Enlarged prostate without lower urinary tract symptoms: Secondary | ICD-10-CM | POA: Diagnosis not present

## 2018-11-03 DIAGNOSIS — E039 Hypothyroidism, unspecified: Secondary | ICD-10-CM | POA: Diagnosis not present

## 2018-11-03 DIAGNOSIS — E782 Mixed hyperlipidemia: Secondary | ICD-10-CM | POA: Diagnosis not present

## 2018-11-03 DIAGNOSIS — Z125 Encounter for screening for malignant neoplasm of prostate: Secondary | ICD-10-CM | POA: Diagnosis not present

## 2018-11-03 DIAGNOSIS — N183 Chronic kidney disease, stage 3 (moderate): Secondary | ICD-10-CM | POA: Diagnosis not present

## 2018-11-03 DIAGNOSIS — I10 Essential (primary) hypertension: Secondary | ICD-10-CM | POA: Diagnosis not present

## 2018-11-03 DIAGNOSIS — Z0000 Encounter for general adult medical examination without abnormal findings: Secondary | ICD-10-CM | POA: Diagnosis not present

## 2018-11-03 DIAGNOSIS — N400 Enlarged prostate without lower urinary tract symptoms: Secondary | ICD-10-CM | POA: Diagnosis not present

## 2018-11-03 DIAGNOSIS — K219 Gastro-esophageal reflux disease without esophagitis: Secondary | ICD-10-CM | POA: Diagnosis not present

## 2018-11-08 DIAGNOSIS — E039 Hypothyroidism, unspecified: Secondary | ICD-10-CM | POA: Diagnosis not present

## 2018-11-08 DIAGNOSIS — Z125 Encounter for screening for malignant neoplasm of prostate: Secondary | ICD-10-CM | POA: Diagnosis not present

## 2018-11-08 DIAGNOSIS — E782 Mixed hyperlipidemia: Secondary | ICD-10-CM | POA: Diagnosis not present

## 2018-11-08 DIAGNOSIS — I10 Essential (primary) hypertension: Secondary | ICD-10-CM | POA: Diagnosis not present

## 2019-01-31 DIAGNOSIS — N183 Chronic kidney disease, stage 3 (moderate): Secondary | ICD-10-CM | POA: Diagnosis not present

## 2019-02-06 DIAGNOSIS — Z23 Encounter for immunization: Secondary | ICD-10-CM | POA: Diagnosis not present

## 2019-06-15 DIAGNOSIS — N183 Chronic kidney disease, stage 3 unspecified: Secondary | ICD-10-CM | POA: Diagnosis not present

## 2019-06-15 DIAGNOSIS — E039 Hypothyroidism, unspecified: Secondary | ICD-10-CM | POA: Diagnosis not present

## 2019-06-15 DIAGNOSIS — M79661 Pain in right lower leg: Secondary | ICD-10-CM | POA: Diagnosis not present

## 2019-06-15 DIAGNOSIS — M79662 Pain in left lower leg: Secondary | ICD-10-CM | POA: Diagnosis not present

## 2019-06-15 DIAGNOSIS — N400 Enlarged prostate without lower urinary tract symptoms: Secondary | ICD-10-CM | POA: Diagnosis not present

## 2019-06-15 DIAGNOSIS — E7800 Pure hypercholesterolemia, unspecified: Secondary | ICD-10-CM | POA: Diagnosis not present

## 2019-06-15 DIAGNOSIS — Z6829 Body mass index (BMI) 29.0-29.9, adult: Secondary | ICD-10-CM | POA: Diagnosis not present

## 2019-10-30 DIAGNOSIS — I10 Essential (primary) hypertension: Secondary | ICD-10-CM | POA: Diagnosis not present

## 2019-10-30 DIAGNOSIS — N400 Enlarged prostate without lower urinary tract symptoms: Secondary | ICD-10-CM | POA: Diagnosis not present

## 2019-11-02 DIAGNOSIS — H6121 Impacted cerumen, right ear: Secondary | ICD-10-CM | POA: Diagnosis not present

## 2019-12-28 DIAGNOSIS — N183 Chronic kidney disease, stage 3 unspecified: Secondary | ICD-10-CM | POA: Diagnosis not present

## 2019-12-28 DIAGNOSIS — Z125 Encounter for screening for malignant neoplasm of prostate: Secondary | ICD-10-CM | POA: Diagnosis not present

## 2019-12-28 DIAGNOSIS — I10 Essential (primary) hypertension: Secondary | ICD-10-CM | POA: Diagnosis not present

## 2019-12-28 DIAGNOSIS — Z789 Other specified health status: Secondary | ICD-10-CM | POA: Diagnosis not present

## 2019-12-28 DIAGNOSIS — Z7689 Persons encountering health services in other specified circumstances: Secondary | ICD-10-CM | POA: Diagnosis not present

## 2019-12-28 DIAGNOSIS — E039 Hypothyroidism, unspecified: Secondary | ICD-10-CM | POA: Diagnosis not present

## 2019-12-28 DIAGNOSIS — R7401 Elevation of levels of liver transaminase levels: Secondary | ICD-10-CM | POA: Diagnosis not present

## 2019-12-28 DIAGNOSIS — Z6826 Body mass index (BMI) 26.0-26.9, adult: Secondary | ICD-10-CM | POA: Diagnosis not present

## 2019-12-28 DIAGNOSIS — N400 Enlarged prostate without lower urinary tract symptoms: Secondary | ICD-10-CM | POA: Diagnosis not present

## 2019-12-28 DIAGNOSIS — K635 Polyp of colon: Secondary | ICD-10-CM | POA: Diagnosis not present

## 2019-12-28 DIAGNOSIS — E785 Hyperlipidemia, unspecified: Secondary | ICD-10-CM | POA: Diagnosis not present

## 2020-01-24 DIAGNOSIS — K219 Gastro-esophageal reflux disease without esophagitis: Secondary | ICD-10-CM | POA: Diagnosis not present

## 2020-01-24 DIAGNOSIS — Z0000 Encounter for general adult medical examination without abnormal findings: Secondary | ICD-10-CM | POA: Diagnosis not present

## 2020-01-24 DIAGNOSIS — I10 Essential (primary) hypertension: Secondary | ICD-10-CM | POA: Diagnosis not present

## 2020-01-24 DIAGNOSIS — Z6826 Body mass index (BMI) 26.0-26.9, adult: Secondary | ICD-10-CM | POA: Diagnosis not present

## 2020-02-20 DIAGNOSIS — D128 Benign neoplasm of rectum: Secondary | ICD-10-CM | POA: Diagnosis not present

## 2020-02-20 DIAGNOSIS — K641 Second degree hemorrhoids: Secondary | ICD-10-CM | POA: Diagnosis not present

## 2020-02-20 DIAGNOSIS — Z86010 Personal history of colonic polyps: Secondary | ICD-10-CM | POA: Diagnosis not present

## 2020-02-20 DIAGNOSIS — K5730 Diverticulosis of large intestine without perforation or abscess without bleeding: Secondary | ICD-10-CM | POA: Diagnosis not present

## 2020-02-20 DIAGNOSIS — D123 Benign neoplasm of transverse colon: Secondary | ICD-10-CM | POA: Diagnosis not present

## 2020-04-01 DIAGNOSIS — Z23 Encounter for immunization: Secondary | ICD-10-CM | POA: Diagnosis not present

## 2020-04-18 DIAGNOSIS — Z23 Encounter for immunization: Secondary | ICD-10-CM | POA: Diagnosis not present

## 2020-05-07 DIAGNOSIS — N183 Chronic kidney disease, stage 3 unspecified: Secondary | ICD-10-CM | POA: Diagnosis not present

## 2020-05-07 DIAGNOSIS — I10 Essential (primary) hypertension: Secondary | ICD-10-CM | POA: Diagnosis not present

## 2020-05-07 DIAGNOSIS — Z6826 Body mass index (BMI) 26.0-26.9, adult: Secondary | ICD-10-CM | POA: Diagnosis not present

## 2020-05-07 DIAGNOSIS — L309 Dermatitis, unspecified: Secondary | ICD-10-CM | POA: Diagnosis not present

## 2020-05-07 DIAGNOSIS — R7401 Elevation of levels of liver transaminase levels: Secondary | ICD-10-CM | POA: Diagnosis not present
